# Patient Record
Sex: Male | Born: 1940 | Race: White | Hispanic: No | Marital: Married | Smoking: Former smoker
Health system: Southern US, Community
[De-identification: ages and names within clinical notes are randomized; demographics above are authoritative.]

## PROBLEM LIST (undated history)

## (undated) DIAGNOSIS — J449 Chronic obstructive pulmonary disease, unspecified: Secondary | ICD-10-CM

## (undated) DIAGNOSIS — I493 Ventricular premature depolarization: Secondary | ICD-10-CM

## (undated) HISTORY — PX: INGUINAL HERNIA REPAIR: SUR1180

## (undated) HISTORY — DX: Chronic obstructive pulmonary disease, unspecified: J44.9

## (undated) HISTORY — DX: Ventricular premature depolarization: I49.3

## (undated) HISTORY — PX: ROTATOR CUFF REPAIR: SHX139

---

## 2004-11-22 ENCOUNTER — Ambulatory Visit: Payer: Self-pay | Admitting: Family Medicine

## 2005-03-17 ENCOUNTER — Ambulatory Visit: Payer: Self-pay | Admitting: Family Medicine

## 2005-04-18 ENCOUNTER — Ambulatory Visit: Payer: Self-pay | Admitting: Family Medicine

## 2005-05-02 ENCOUNTER — Ambulatory Visit: Payer: Self-pay | Admitting: Family Medicine

## 2005-06-14 ENCOUNTER — Ambulatory Visit: Payer: Self-pay | Admitting: Family Medicine

## 2005-10-02 ENCOUNTER — Ambulatory Visit: Payer: Self-pay | Admitting: Family Medicine

## 2005-10-04 ENCOUNTER — Ambulatory Visit: Payer: Self-pay | Admitting: Family Medicine

## 2013-03-18 DIAGNOSIS — G629 Polyneuropathy, unspecified: Secondary | ICD-10-CM | POA: Insufficient documentation

## 2016-01-28 DIAGNOSIS — G4733 Obstructive sleep apnea (adult) (pediatric): Secondary | ICD-10-CM | POA: Insufficient documentation

## 2016-01-28 DIAGNOSIS — G4700 Insomnia, unspecified: Secondary | ICD-10-CM | POA: Insufficient documentation

## 2016-01-28 DIAGNOSIS — K219 Gastro-esophageal reflux disease without esophagitis: Secondary | ICD-10-CM | POA: Insufficient documentation

## 2016-01-28 DIAGNOSIS — E782 Mixed hyperlipidemia: Secondary | ICD-10-CM | POA: Insufficient documentation

## 2016-01-28 DIAGNOSIS — G2581 Restless legs syndrome: Secondary | ICD-10-CM | POA: Insufficient documentation

## 2016-01-28 HISTORY — DX: Mixed hyperlipidemia: E78.2

## 2016-01-28 HISTORY — DX: Gastro-esophageal reflux disease without esophagitis: K21.9

## 2016-01-28 HISTORY — DX: Obstructive sleep apnea (adult) (pediatric): G47.33

## 2017-10-08 DIAGNOSIS — M75111 Incomplete rotator cuff tear or rupture of right shoulder, not specified as traumatic: Secondary | ICD-10-CM | POA: Insufficient documentation

## 2017-10-12 DIAGNOSIS — Z01818 Encounter for other preprocedural examination: Secondary | ICD-10-CM

## 2017-10-15 DIAGNOSIS — R55 Syncope and collapse: Secondary | ICD-10-CM | POA: Diagnosis not present

## 2017-10-16 DIAGNOSIS — R55 Syncope and collapse: Secondary | ICD-10-CM | POA: Diagnosis not present

## 2017-10-30 ENCOUNTER — Encounter: Payer: Self-pay | Admitting: *Deleted

## 2017-10-30 ENCOUNTER — Encounter: Payer: Self-pay | Admitting: Cardiology

## 2017-10-30 ENCOUNTER — Ambulatory Visit: Payer: Medicare HMO | Admitting: Cardiology

## 2017-10-30 VITALS — BP 110/80 | HR 86 | Ht 72.0 in | Wt 175.1 lb

## 2017-10-30 DIAGNOSIS — R55 Syncope and collapse: Secondary | ICD-10-CM | POA: Insufficient documentation

## 2017-10-30 DIAGNOSIS — E782 Mixed hyperlipidemia: Secondary | ICD-10-CM | POA: Diagnosis not present

## 2017-10-30 DIAGNOSIS — G9340 Encephalopathy, unspecified: Secondary | ICD-10-CM | POA: Insufficient documentation

## 2017-10-30 DIAGNOSIS — K219 Gastro-esophageal reflux disease without esophagitis: Secondary | ICD-10-CM | POA: Diagnosis not present

## 2017-10-30 DIAGNOSIS — G4733 Obstructive sleep apnea (adult) (pediatric): Secondary | ICD-10-CM | POA: Diagnosis not present

## 2017-10-30 HISTORY — DX: Encephalopathy, unspecified: G93.40

## 2017-10-30 NOTE — Progress Notes (Signed)
Cardiology Office Note:    Date:  10/30/2017   ID:  Kyle Payne, DOB 1941-04-12, MRN [id number]  PCP:  Olive Bassough, Lauryl Seyer L, MD  Cardiologist:  Gypsy Balsamobert Marjani Kobel, MD    Referring MD: Olive Bassough, Mort Smelser L, MD   Chief Complaint  Patient presents with  . Hospitalization Follow-up  I passed out  History of Present Illness:    Kyle Payne is a 77 y.o. male with past medical history significant for recurrent syncope.  I been taking care of him for last few years history is quite interesting every year in March she passed out last year was the first year within last 4 years that he did not passed out in March.  However, he ended up having a right rotator cuff surgery.  He went to his orthopedic doctor for a dressing change and he was sitting in the waiting room started feeling no right, started feeling dizzy this sensation lasted for about 1 minute upon he was very pale during the episodes and then he completely passed out.  EMS was called next thing he knows that a lot of people standing and trying to hold him.  He was perfectly with it after that.  He did not pass stool he did not pass urine he did not bite his tongue.  Episode look very similar to episodes that he described before.  He was taken to the hospital quite extensive evaluation has been done that included a CT of his chest to rule out pulmonary emboli which she did not have, his troponins were normal, echocardiogram was done which showed preserved left ventricular ejection fraction with left ventricular hypertrophy.  Carotid ultrasounds did not show any critical lesions.  Basically there was no clear-cut explanation for episode of syncope.  I always suspected that he does have vasovagal syncope.  Quite extensive evaluation have done before I which was negative.  He tells me also when he sees somebody being injured he get very queasy to the point of passing out.  Past Medical History:  Diagnosis Date  . COPD (chronic obstructive pulmonary disease)  (HCC)       Current Medications: Current Meds  Medication Sig  . aspirin 325 MG tablet Take 1 tablet by mouth daily.  . Cyanocobalamin (B-12 TR) 2000 MCG TBCR Take 1 tablet by mouth daily.  . DOCOSAHEXAENOIC ACID PO Take 2,000 mg by mouth daily.  . Multiple Vitamins-Minerals (CENTRUM ADULTS) TABS Take 1 tablet by mouth daily.  Marland Kitchen. omeprazole (PRILOSEC) 20 MG capsule Take 1 capsule by mouth daily.     Allergies:   Niacin and related   Social History   Socioeconomic History  . Marital status: Married    Spouse name: Not on file  . Number of children: Not on file  . Years of education: Not on file  . Highest education level: Not on file  Occupational History  . Not on file  Social Needs  . Financial resource strain: Not on file  . Food insecurity:    Worry: Not on file    Inability: Not on file  . Transportation needs:    Medical: Not on file    Non-medical: Not on file  Tobacco Use  . Smoking status: Former Games developermoker  . Smokeless tobacco: Never Used  Substance and Sexual Activity  . Alcohol use: Never    Frequency: Never  . Drug use: Never  . Sexual activity: Not on file  Lifestyle  . Physical activity:    Days per  week: Not on file    Minutes per session: Not on file  . Stress: Not on file  Relationships  . Social connections:    Talks on phone: Not on file    Gets together: Not on file    Attends religious service: Not on file    Active member of club or organization: Not on file    Attends meetings of clubs or organizations: Not on file    Relationship status: Not on file  Other Topics Concern  . Not on file  Social History Narrative  . Not on file     Family History: The patient's family history includes Breast cancer in his sister and sister; Hypertension in his father; Stroke in his father. ROS:   Please see the history of present illness.    All 14 point review of systems negative except as described per history of present illness  EKGs/Labs/Other  Studies Reviewed:      Recent Labs: No results found for requested labs within last 8760 hours.  Recent Lipid Panel No results found for: CHOL, TRIG, HDL, CHOLHDL, VLDL, LDLCALC, LDLDIRECT  Physical Exam:    VS:  BP 110/80   Pulse 86   Ht 6' (1.829 m)   Wt 175 lb 1.9 oz (79.4 kg)   SpO2 95%   BMI 23.75 kg/m     Wt Readings from Last 3 Encounters:  10/30/17 175 lb 1.9 oz (79.4 kg)     GEN:  Well nourished, well developed in no acute distress HEENT: Normal NECK: No JVD; No carotid bruits LYMPHATICS: No lymphadenopathy CARDIAC: RRR, no murmurs, no rubs, no gallops RESPIRATORY:  Clear to auscultation without rales, wheezing or rhonchi  ABDOMEN: Soft, non-tender, non-distended MUSCULOSKELETAL:  No edema; No deformity  SKIN: Warm and dry LOWER EXTREMITIES: no swelling NEUROLOGIC:  Alert and oriented x 3 PSYCHIATRIC:  Normal affect   ASSESSMENT:    1. Hyperlipidemia, mixed   2. Vasovagal syncope   3. Gastroesophageal reflux disease without esophagitis   4. Obstructive sleep apnea    PLAN:    In order of problems listed above:  1. Syncope: I suspect this is a vasovagal reaction.  He did have prodromal symptoms before that.  He also gets similar sensation when he see somebody being injured.  He does have previous history of vasovagal syncope.  I still think it would be reasonable to both long-term Holter monitor on him I want him to make sure he understands the degree of bradycardia with his episodes.  I would is quite incredible is the fact that he always have episode of syncope in March there is no logical explanation for this. 2. We discussed preventive measures that he can take to abort or prevent those episodes from happening.  He admits that he does not drink enough fluids he needs to do that.  Also told him to liberalize his salt intake somewhat.  I also instructed him when he will feel distinct coming to sit down afebrile bili lay down to avoid any injury and abort  those episodes. 3. Hyperlipidemia: To be followed by primary care physician.  Try to get a copy of his fasting lipid profile. 4. Gastroesophageal reflux disease: Denies having a problem 5. Selective sleep apnea: Followed by primary care physician.  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  Medication Adjustments/Labs and Tests Ordered: Current medicines are reviewed at length with the patient today.  Concerns regarding medicines are outlined above.  No orders of the defined types were placed in this encounter.  Medication changes: No orders of the defined types were placed in this encounter.   Signed, Georgeanna Lea, MD, [facility] 10/30/2017 10:44 AM    [facility]

## 2017-10-30 NOTE — Patient Instructions (Signed)
Medication Instructions:  Your physician recommends that you continue on your current medications as directed. Please refer to the Current Medication list given to you today.   Labwork: None  Testing/Procedures: Your physician has recommended that you wear an event monitor. Event monitors are medical devices that record the heart's electrical activity. Doctors most often us these monitors to diagnose arrhythmias. Arrhythmias are problems with the speed or rhythm of the heartbeat. The monitor is a small, portable device. You can wear one while you do your normal daily activities. This is usually used to diagnose what is causing palpitations/syncope (passing out). Wear for 30 days.  Follow-Up: Your physician recommends that you schedule a follow-up appointment in: 2 months.  Any Other Special Instructions Will Be Listed Below (If Applicable).     If you need a refill on your cardiac medications before your next appointment, please call your pharmacy.

## 2017-10-31 ENCOUNTER — Telehealth: Payer: Self-pay | Admitting: *Deleted

## 2017-10-31 NOTE — Telephone Encounter (Signed)
Contacted PCP office after patient's office visit yesterday to get most recent lab work. Patient hasn't had a lipid panel checked since March 2015. Dr. Bing MatterKrasowski advised to check lipid panel now. Called to inform patient and the patient stated "He will not take any cholesterol medication" due to side effects "so there is no point in checking" his cholesterol. Dr. Bing MatterKrasowski made aware.

## 2017-10-31 NOTE — Addendum Note (Signed)
Addended by: Crist FatLOCKHART, CATHERINE P on: 10/31/2017 08:31 AM   Modules accepted: Orders

## 2017-11-06 ENCOUNTER — Ambulatory Visit: Payer: Medicare HMO

## 2018-01-07 ENCOUNTER — Ambulatory Visit: Payer: Medicare HMO | Admitting: Cardiology

## 2018-01-07 ENCOUNTER — Encounter: Payer: Self-pay | Admitting: Cardiology

## 2018-01-07 VITALS — BP 120/68 | HR 83 | Ht 72.0 in | Wt 181.0 lb

## 2018-01-07 DIAGNOSIS — E782 Mixed hyperlipidemia: Secondary | ICD-10-CM

## 2018-01-07 DIAGNOSIS — R55 Syncope and collapse: Secondary | ICD-10-CM | POA: Diagnosis not present

## 2018-01-07 DIAGNOSIS — G4733 Obstructive sleep apnea (adult) (pediatric): Secondary | ICD-10-CM

## 2018-01-07 NOTE — Progress Notes (Signed)
Cardiology Office Note:    Date:  01/07/2018   ID:  Kyle Payne, DOB Sep 15, 1940, MRN [id number]  PCP:  Olive Bass, MD  Cardiologist:  Gypsy Balsam, MD    Referring MD: Olive Bass, MD   Chief Complaint  Patient presents with  . Follow-up  Doing well  History of Present Illness:    Kyle Payne is a 77 y.o. male with history of recurrent syncope.  Shane Crutch is quite interesting every year in March she was passed out.  Then he had a break for a few years that he did not passed out however this year it happened again.  It looks like it is a vasovagal syncope.  He does have prodromal syndromes of the top of that when he see somebody getting injections or being hurt he get dizzy to the point of passing out.  He did wear event recorder make sure there is no significant arrhythmia.  He did have some atrial tachycardia short lasting with good sinus node recovery time.  There is no explanation for his syncope based on the results of event recorder.  Therefore.  I will encourage him to stay well-hydrated liberated salt intake and simply sit down and putting his head down if he start feeling that syncope is coming again.  Past Medical History:  Diagnosis Date  . COPD (chronic obstructive pulmonary disease) (HCC)       Current Medications: Current Meds  Medication Sig  . aspirin 325 MG tablet Take 1 tablet by mouth daily.  . Cyanocobalamin (B-12 TR) 2000 MCG TBCR Take 1 tablet by mouth daily.  . DOCOSAHEXAENOIC ACID PO Take 2,000 mg by mouth daily.  . Multiple Vitamins-Minerals (CENTRUM ADULTS) TABS Take 1 tablet by mouth daily.  Marland Kitchen omeprazole (PRILOSEC) 20 MG capsule Take 1 capsule by mouth daily.     Allergies:   Niacin and related   Social History   Socioeconomic History  . Marital status: Married    Spouse name: Not on file  . Number of children: Not on file  . Years of education: Not on file  . Highest education level: Not on file  Occupational History  . Not on  file  Social Needs  . Financial resource strain: Not on file  . Food insecurity:    Worry: Not on file    Inability: Not on file  . Transportation needs:    Medical: Not on file    Non-medical: Not on file  Tobacco Use  . Smoking status: Former Games developer  . Smokeless tobacco: Never Used  Substance and Sexual Activity  . Alcohol use: Never    Frequency: Never  . Drug use: Never  . Sexual activity: Not on file  Lifestyle  . Physical activity:    Days per week: Not on file    Minutes per session: Not on file  . Stress: Not on file  Relationships  . Social connections:    Talks on phone: Not on file    Gets together: Not on file    Attends religious service: Not on file    Active member of club or organization: Not on file    Attends meetings of clubs or organizations: Not on file    Relationship status: Not on file  Other Topics Concern  . Not on file  Social History Narrative  . Not on file     Family History: The patient's family history includes Breast cancer in his sister and sister; Hypertension in  his father; Stroke in his father. ROS:   Please see the history of present illness.    All 14 point review of systems negative except as described per history of present illness  EKGs/Labs/Other Studies Reviewed:      Recent Labs: No results found for requested labs within last 8760 hours.  Recent Lipid Panel No results found for: CHOL, TRIG, HDL, CHOLHDL, VLDL, LDLCALC, LDLDIRECT  Physical Exam:    VS:  BP 120/68   Pulse 83   Ht 6' (1.829 m)   Wt 181 lb (82.1 kg)   SpO2 93%   BMI 24.55 kg/m     Wt Readings from Last 3 Encounters:  01/07/18 181 lb (82.1 kg)  10/30/17 175 lb 1.9 oz (79.4 kg)     GEN:  Well nourished, well developed in no acute distress HEENT: Normal NECK: No JVD; No carotid bruits LYMPHATICS: No lymphadenopathy CARDIAC: RRR, no murmurs, no rubs, no gallops RESPIRATORY:  Clear to auscultation without rales, wheezing or rhonchi  ABDOMEN:  Soft, non-tender, non-distended MUSCULOSKELETAL:  No edema; No deformity  SKIN: Warm and dry LOWER EXTREMITIES: no swelling NEUROLOGIC:  Alert and oriented x 3 PSYCHIATRIC:  Normal affect   ASSESSMENT:    1. Vasovagal syncope   2. Obstructive sleep apnea   3. Hyperlipidemia, mixed    PLAN:    In order of problems listed above:  1. Vasovagal syncope: Plan as outlined above. 2. Obstructive sleep apnea: Managed by internal medicine team. 3. Dyslipidemia: He agreed to have his fasting lipid profile done which I will check it today.  He told me right away to his narcotic any statin since he did have some problem before however there may be some role for [employer] 9 agent  See him back in my office in 6 months   Medication Adjustments/Labs and Tests Ordered: Current medicines are reviewed at length with the patient today.  Concerns regarding medicines are outlined above.  No orders of the defined types were placed in this encounter.  Medication changes: No orders of the defined types were placed in this encounter.   Signed, Georgeanna Leaobert J. Korbyn Chopin, MD, [facility] 01/07/2018 10:30 AM    [facility]

## 2018-01-07 NOTE — Patient Instructions (Signed)
Medication Instructions:  Your physician recommends that you continue on your current medications as directed. Please refer to the Current Medication list given to you today.   Labwork: Your physician recommends that you return for lab work today: lipid panel.   Testing/Procedures: None  Follow-Up: Your physician wants you to follow-up in: 6 months. You will receive a reminder letter in the mail two months in advance. If you don't receive a letter, please call our office to schedule the follow-up appointment.   If you need a refill on your cardiac medications before your next appointment, please call your pharmacy.   Thank you for choosing [facility]! Ora Mcnatt, RN [phone]    

## 2018-01-08 LAB — LIPID PANEL
CHOLESTEROL TOTAL: 252 mg/dL — AB (ref 100–199)
Chol/HDL Ratio: 5.6 ratio — ABNORMAL HIGH (ref 0.0–5.0)
HDL: 45 mg/dL (ref >39)
LDL CALC: 180 mg/dL — AB (ref 0–99)
TRIGLYCERIDES: 133 mg/dL (ref 0–149)
VLDL CHOLESTEROL CAL: 27 mg/dL (ref 5–40)

## 2018-03-08 DIAGNOSIS — G588 Other specified mononeuropathies: Secondary | ICD-10-CM | POA: Insufficient documentation

## 2018-03-08 HISTORY — DX: Other specified mononeuropathies: G58.8

## 2019-04-28 DIAGNOSIS — H2510 Age-related nuclear cataract, unspecified eye: Secondary | ICD-10-CM

## 2019-04-28 HISTORY — DX: Age-related nuclear cataract, unspecified eye: H25.10

## 2019-06-30 DIAGNOSIS — M722 Plantar fascial fibromatosis: Secondary | ICD-10-CM

## 2019-06-30 HISTORY — DX: Plantar fascial fibromatosis: M72.2

## 2020-01-21 DIAGNOSIS — J40 Bronchitis, not specified as acute or chronic: Secondary | ICD-10-CM | POA: Insufficient documentation

## 2020-01-21 HISTORY — DX: Bronchitis, not specified as acute or chronic: J40

## 2020-07-09 ENCOUNTER — Emergency Department (HOSPITAL_COMMUNITY): Payer: Medicare HMO

## 2020-07-09 ENCOUNTER — Other Ambulatory Visit: Payer: Self-pay

## 2020-07-09 ENCOUNTER — Inpatient Hospital Stay (HOSPITAL_COMMUNITY)
Admission: EM | Admit: 2020-07-09 | Discharge: 2020-07-13 | DRG: 871 | Disposition: A | Discharge status: Home / Self Care | Payer: Medicare HMO | Attending: Internal Medicine | Admitting: Internal Medicine

## 2020-07-09 ENCOUNTER — Encounter (HOSPITAL_COMMUNITY): Payer: Self-pay

## 2020-07-09 DIAGNOSIS — I700 Atherosclerosis of aorta: Secondary | ICD-10-CM | POA: Diagnosis present

## 2020-07-09 DIAGNOSIS — G629 Polyneuropathy, unspecified: Secondary | ICD-10-CM | POA: Diagnosis present

## 2020-07-09 DIAGNOSIS — Z87891 Personal history of nicotine dependence: Secondary | ICD-10-CM

## 2020-07-09 DIAGNOSIS — G9340 Encephalopathy, unspecified: Secondary | ICD-10-CM | POA: Diagnosis not present

## 2020-07-09 DIAGNOSIS — R4182 Altered mental status, unspecified: Secondary | ICD-10-CM

## 2020-07-09 DIAGNOSIS — J189 Pneumonia, unspecified organism: Secondary | ICD-10-CM | POA: Diagnosis present

## 2020-07-09 DIAGNOSIS — G459 Transient cerebral ischemic attack, unspecified: Secondary | ICD-10-CM

## 2020-07-09 DIAGNOSIS — E162 Hypoglycemia, unspecified: Secondary | ICD-10-CM | POA: Diagnosis not present

## 2020-07-09 DIAGNOSIS — I340 Nonrheumatic mitral (valve) insufficiency: Secondary | ICD-10-CM | POA: Diagnosis not present

## 2020-07-09 DIAGNOSIS — Z781 Physical restraint status: Secondary | ICD-10-CM | POA: Diagnosis not present

## 2020-07-09 DIAGNOSIS — I952 Hypotension due to drugs: Secondary | ICD-10-CM | POA: Diagnosis not present

## 2020-07-09 DIAGNOSIS — E669 Obesity, unspecified: Secondary | ICD-10-CM | POA: Diagnosis present

## 2020-07-09 DIAGNOSIS — Z0189 Encounter for other specified special examinations: Secondary | ICD-10-CM

## 2020-07-09 DIAGNOSIS — E876 Hypokalemia: Secondary | ICD-10-CM | POA: Diagnosis present

## 2020-07-09 DIAGNOSIS — R680 Hypothermia, not associated with low environmental temperature: Secondary | ICD-10-CM | POA: Diagnosis present

## 2020-07-09 DIAGNOSIS — E785 Hyperlipidemia, unspecified: Secondary | ICD-10-CM | POA: Diagnosis present

## 2020-07-09 DIAGNOSIS — R471 Dysarthria and anarthria: Secondary | ICD-10-CM | POA: Diagnosis present

## 2020-07-09 DIAGNOSIS — Z79899 Other long term (current) drug therapy: Secondary | ICD-10-CM

## 2020-07-09 DIAGNOSIS — I639 Cerebral infarction, unspecified: Secondary | ICD-10-CM

## 2020-07-09 DIAGNOSIS — Z7982 Long term (current) use of aspirin: Secondary | ICD-10-CM | POA: Diagnosis not present

## 2020-07-09 DIAGNOSIS — Z4659 Encounter for fitting and adjustment of other gastrointestinal appliance and device: Secondary | ICD-10-CM

## 2020-07-09 DIAGNOSIS — J9602 Acute respiratory failure with hypercapnia: Secondary | ICD-10-CM | POA: Diagnosis present

## 2020-07-09 DIAGNOSIS — G4733 Obstructive sleep apnea (adult) (pediatric): Secondary | ICD-10-CM | POA: Diagnosis present

## 2020-07-09 DIAGNOSIS — Z823 Family history of stroke: Secondary | ICD-10-CM

## 2020-07-09 DIAGNOSIS — G9341 Metabolic encephalopathy: Secondary | ICD-10-CM | POA: Diagnosis present

## 2020-07-09 DIAGNOSIS — Z803 Family history of malignant neoplasm of breast: Secondary | ICD-10-CM

## 2020-07-09 DIAGNOSIS — J9601 Acute respiratory failure with hypoxia: Secondary | ICD-10-CM

## 2020-07-09 DIAGNOSIS — A419 Sepsis, unspecified organism: Principal | ICD-10-CM | POA: Diagnosis present

## 2020-07-09 DIAGNOSIS — R001 Bradycardia, unspecified: Secondary | ICD-10-CM | POA: Diagnosis not present

## 2020-07-09 DIAGNOSIS — J439 Emphysema, unspecified: Secondary | ICD-10-CM | POA: Diagnosis present

## 2020-07-09 DIAGNOSIS — Z8249 Family history of ischemic heart disease and other diseases of the circulatory system: Secondary | ICD-10-CM

## 2020-07-09 DIAGNOSIS — Z20822 Contact with and (suspected) exposure to covid-19: Secondary | ICD-10-CM | POA: Diagnosis present

## 2020-07-09 DIAGNOSIS — T4275XA Adverse effect of unspecified antiepileptic and sedative-hypnotic drugs, initial encounter: Secondary | ICD-10-CM | POA: Diagnosis not present

## 2020-07-09 DIAGNOSIS — R946 Abnormal results of thyroid function studies: Secondary | ICD-10-CM | POA: Diagnosis present

## 2020-07-09 HISTORY — DX: Acute respiratory failure with hypoxia: J96.01

## 2020-07-09 LAB — I-STAT CHEM 8, ED
BUN: 23 mg/dL (ref 8–23)
Calcium, Ion: 1.22 mmol/L (ref 1.15–1.40)
Chloride: 100 mmol/L (ref 98–111)
Creatinine, Ser: 1 mg/dL (ref 0.61–1.24)
Glucose, Bld: 94 mg/dL (ref 70–99)
HCT: 46 % (ref 39.0–52.0)
Hemoglobin: 15.6 g/dL (ref 13.0–17.0)
Potassium: 3.9 mmol/L (ref 3.5–5.1)
Sodium: 138 mmol/L (ref 135–145)
TCO2: 27 mmol/L (ref 22–32)

## 2020-07-09 LAB — COMPREHENSIVE METABOLIC PANEL
ALT: 21 U/L (ref 0–44)
AST: 22 U/L (ref 15–41)
Albumin: 4.1 g/dL (ref 3.5–5.0)
Alkaline Phosphatase: 49 U/L (ref 38–126)
Anion gap: 12 (ref 5–15)
BUN: 19 mg/dL (ref 8–23)
CO2: 27 mmol/L (ref 22–32)
Calcium: 9.6 mg/dL (ref 8.9–10.3)
Chloride: 99 mmol/L (ref 98–111)
Creatinine, Ser: 1.09 mg/dL (ref 0.61–1.24)
GFR, Estimated: >60 mL/min (ref >60)
Glucose, Bld: 97 mg/dL (ref 70–99)
Potassium: 3.8 mmol/L (ref 3.5–5.1)
Sodium: 138 mmol/L (ref 135–145)
Total Bilirubin: 0.6 mg/dL (ref 0.3–1.2)
Total Protein: 7.1 g/dL (ref 6.5–8.1)

## 2020-07-09 LAB — DIFFERENTIAL
Abs Immature Granulocytes: 0.02 10*3/uL (ref 0.00–0.07)
Basophils Absolute: 0 10*3/uL (ref 0.0–0.1)
Basophils Relative: 1 %
Eosinophils Absolute: 0.2 10*3/uL (ref 0.0–0.5)
Eosinophils Relative: 3 %
Immature Granulocytes: 0 %
Lymphocytes Relative: 27 %
Lymphs Abs: 1.5 10*3/uL (ref 0.7–4.0)
Monocytes Absolute: 0.8 10*3/uL (ref 0.1–1.0)
Monocytes Relative: 14 %
Neutro Abs: 3.2 10*3/uL (ref 1.7–7.7)
Neutrophils Relative %: 55 %

## 2020-07-09 LAB — I-STAT ARTERIAL BLOOD GAS, ED
Acid-base deficit: 1 mmol/L (ref 0.0–2.0)
Bicarbonate: 27.5 mmol/L (ref 20.0–28.0)
Calcium, Ion: 1.18 mmol/L (ref 1.15–1.40)
HCT: 40 % (ref 39.0–52.0)
Hemoglobin: 13.6 g/dL (ref 13.0–17.0)
O2 Saturation: 100 %
Patient temperature: 98.6
Potassium: 4 mmol/L (ref 3.5–5.1)
Sodium: 137 mmol/L (ref 135–145)
TCO2: 29 mmol/L (ref 22–32)
pCO2 arterial: 60.5 mmHg — ABNORMAL HIGH (ref 32.0–48.0)
pH, Arterial: 7.265 — ABNORMAL LOW (ref 7.350–7.450)
pO2, Arterial: 332 mmHg — ABNORMAL HIGH (ref 83.0–108.0)

## 2020-07-09 LAB — PROTIME-INR
INR: 1 (ref 0.8–1.2)
Prothrombin Time: 13 seconds (ref 11.4–15.2)

## 2020-07-09 LAB — URINALYSIS, ROUTINE W REFLEX MICROSCOPIC
Bilirubin Urine: NEGATIVE
Glucose, UA: NEGATIVE mg/dL
Hgb urine dipstick: NEGATIVE
Ketones, ur: NEGATIVE mg/dL
Leukocytes,Ua: NEGATIVE
Nitrite: NEGATIVE
Protein, ur: NEGATIVE mg/dL
Specific Gravity, Urine: >1.046 — ABNORMAL HIGH (ref 1.005–1.030)
pH: 5 (ref 5.0–8.0)

## 2020-07-09 LAB — CBG MONITORING, ED
Glucose-Capillary: 88 mg/dL (ref 70–99)
Glucose-Capillary: 93 mg/dL (ref 70–99)

## 2020-07-09 LAB — CBC
HCT: 45.3 % (ref 39.0–52.0)
Hemoglobin: 14.2 g/dL (ref 13.0–17.0)
MCH: 29.7 pg (ref 26.0–34.0)
MCHC: 31.3 g/dL (ref 30.0–36.0)
MCV: 94.8 fL (ref 80.0–100.0)
Platelets: 193 10*3/uL (ref 150–400)
RBC: 4.78 MIL/uL (ref 4.22–5.81)
RDW: 13.2 % (ref 11.5–15.5)
WBC: 5.8 10*3/uL (ref 4.0–10.5)
nRBC: 0 % (ref 0.0–0.2)

## 2020-07-09 LAB — RAPID URINE DRUG SCREEN, HOSP PERFORMED
Amphetamines: NOT DETECTED
Barbiturates: NOT DETECTED
Benzodiazepines: NOT DETECTED
Cocaine: NOT DETECTED
Opiates: NOT DETECTED
Tetrahydrocannabinol: NOT DETECTED

## 2020-07-09 LAB — RESP PANEL BY RT-PCR (FLU A&B, COVID) ARPGX2
Influenza A by PCR: NEGATIVE
Influenza B by PCR: NEGATIVE
SARS Coronavirus 2 by RT PCR: NEGATIVE

## 2020-07-09 LAB — APTT: aPTT: 34 seconds (ref 24–36)

## 2020-07-09 LAB — ETHANOL: Alcohol, Ethyl (B): <10 mg/dL (ref <10)

## 2020-07-09 MED ORDER — SODIUM CHLORIDE 0.9 % IV SOLN
2.0000 g | Freq: Three times a day (TID) | INTRAVENOUS | Status: DC
  Administered 2020-07-09 – 2020-07-13 (×11): 2 g via INTRAVENOUS
  Filled 2020-07-09 (×11): qty 2

## 2020-07-09 MED ORDER — FENTANYL 2500MCG IN NS 250ML (10MCG/ML) PREMIX INFUSION
25.0000 ug/h | INTRAVENOUS | Status: DC
  Administered 2020-07-09: 75 ug/h via INTRAVENOUS
  Filled 2020-07-09: qty 250

## 2020-07-09 MED ORDER — FENTANYL CITRATE (PF) 100 MCG/2ML IJ SOLN
INTRAMUSCULAR | Status: AC | PRN
  Administered 2020-07-09: 50 ug via INTRAVENOUS

## 2020-07-09 MED ORDER — FENTANYL CITRATE (PF) 100 MCG/2ML IJ SOLN
25.0000 ug | Freq: Once | INTRAMUSCULAR | Status: AC
  Administered 2020-07-09: 25 ug via INTRAVENOUS

## 2020-07-09 MED ORDER — PROPOFOL 1000 MG/100ML IV EMUL
0.0000 ug/kg/min | INTRAVENOUS | Status: DC
  Administered 2020-07-09: 30 ug/kg/min via INTRAVENOUS
  Administered 2020-07-10: 02:00:00 10 ug/kg/min via INTRAVENOUS
  Filled 2020-07-09: qty 100

## 2020-07-09 MED ORDER — INSULIN ASPART 100 UNIT/ML ~~LOC~~ SOLN: 2.0000 [IU] | SUBCUTANEOUS | Status: DC

## 2020-07-09 MED ORDER — ETOMIDATE 2 MG/ML IV SOLN
INTRAVENOUS | Status: AC | PRN
  Administered 2020-07-09: 20 mg via INTRAVENOUS

## 2020-07-09 MED ORDER — PROPOFOL 1000 MG/100ML IV EMUL
INTRAVENOUS | Status: AC | PRN
  Administered 2020-07-09: 10 ug/kg/min via INTRAVENOUS

## 2020-07-09 MED ORDER — DOCUSATE SODIUM 100 MG PO CAPS: 100.0000 mg | ORAL_CAPSULE | Freq: Two times a day (BID) | ORAL | Status: DC | PRN

## 2020-07-09 MED ORDER — POLYETHYLENE GLYCOL 3350 17 G PO PACK: 17.0000 g | PACK | Freq: Every day | ORAL | Status: DC | PRN

## 2020-07-09 MED ORDER — POLYETHYLENE GLYCOL 3350 17 G PO PACK: 17.0000 g | PACK | Freq: Every day | ORAL | Status: DC

## 2020-07-09 MED ORDER — ENOXAPARIN SODIUM 40 MG/0.4ML ~~LOC~~ SOLN
40.0000 mg | SUBCUTANEOUS | Status: DC
  Administered 2020-07-09 – 2020-07-12 (×4): 40 mg via SUBCUTANEOUS
  Filled 2020-07-09 (×4): qty 0.4

## 2020-07-09 MED ORDER — PIPERACILLIN-TAZOBACTAM 3.375 G IVPB 30 MIN: 3.3750 g | Freq: Once | INTRAVENOUS | Status: DC

## 2020-07-09 MED ORDER — PANTOPRAZOLE SODIUM 40 MG PO TBEC
40.0000 mg | DELAYED_RELEASE_TABLET | Freq: Every day | ORAL | Status: DC
  Administered 2020-07-10 – 2020-07-13 (×4): 40 mg via ORAL
  Filled 2020-07-09 (×4): qty 1

## 2020-07-09 MED ORDER — VANCOMYCIN HCL 1750 MG/350ML IV SOLN
1750.0000 mg | Freq: Once | INTRAVENOUS | Status: AC
  Administered 2020-07-09: 1750 mg via INTRAVENOUS
  Filled 2020-07-09: qty 350

## 2020-07-09 MED ORDER — IOHEXOL 350 MG/ML SOLN
100.0000 mL | Freq: Once | INTRAVENOUS | Status: AC | PRN
  Administered 2020-07-09: 100 mL via INTRAVENOUS

## 2020-07-09 MED ORDER — IPRATROPIUM-ALBUTEROL 0.5-2.5 (3) MG/3ML IN SOLN: 3.0000 mL | Freq: Four times a day (QID) | RESPIRATORY_TRACT | Status: DC

## 2020-07-09 MED ORDER — SUCCINYLCHOLINE CHLORIDE 20 MG/ML IJ SOLN
INTRAMUSCULAR | Status: AC | PRN
  Administered 2020-07-09: 100 mg via INTRAVENOUS

## 2020-07-09 MED ORDER — FENTANYL BOLUS VIA INFUSION
25.0000 ug | INTRAVENOUS | Status: DC | PRN
Start: 2020-07-09 — End: 2020-07-10
  Filled 2020-07-09: qty 25

## 2020-07-09 MED ORDER — VANCOMYCIN HCL IN DEXTROSE 1-5 GM/200ML-% IV SOLN: 1000.0000 mg | Freq: Two times a day (BID) | INTRAVENOUS | Status: DC

## 2020-07-09 MED ORDER — VANCOMYCIN HCL IN DEXTROSE 1-5 GM/200ML-% IV SOLN: 1000.0000 mg | Freq: Once | INTRAVENOUS | Status: DC

## 2020-07-09 MED ORDER — FENTANYL CITRATE (PF) 100 MCG/2ML IJ SOLN
INTRAMUSCULAR | Status: AC
  Filled 2020-07-09: qty 2

## 2020-07-09 MED ORDER — SODIUM CHLORIDE 0.9 % IV SOLN
500.0000 mg | Freq: Every day | INTRAVENOUS | Status: DC
  Administered 2020-07-10 – 2020-07-12 (×3): 500 mg via INTRAVENOUS
  Filled 2020-07-09 (×3): qty 500

## 2020-07-09 MED ORDER — IPRATROPIUM-ALBUTEROL 0.5-2.5 (3) MG/3ML IN SOLN
3.0000 mL | Freq: Four times a day (QID) | RESPIRATORY_TRACT | Status: DC
  Administered 2020-07-10 (×3): 3 mL via RESPIRATORY_TRACT
  Filled 2020-07-09 (×3): qty 3

## 2020-07-09 MED ORDER — NOREPINEPHRINE 4 MG/250ML-% IV SOLN: 0.0000 ug/min | INTRAVENOUS | Status: DC

## 2020-07-09 MED ORDER — NOREPINEPHRINE 4 MG/250ML-% IV SOLN
INTRAVENOUS | Status: AC
  Administered 2020-07-09: 21:00:00 2 ug/min via INTRAVENOUS
  Filled 2020-07-09: qty 250

## 2020-07-09 MED ORDER — DOCUSATE SODIUM 50 MG/5ML PO LIQD: 100.0000 mg | Freq: Two times a day (BID) | ORAL | Status: DC

## 2020-07-09 NOTE — Code Documentation (Signed)
Stroke Response Nurse Documentation Code Documentation  Kyle Payne is a 79 y.o. male arriving to [facility] ED via [facility] on 12/17 with past medical hx of COPD. Code stroke was activated by EMS. Patient from home where he was LKW at 1710 and now complaining of respiratory distress, slurred speech and left sided weakness. On ASA per patient. Stroke team at the bedside on patient arrival. Labs drawn and patient cleared for CT by Dr. Peterson Lombard. Patient to CT after intubation with team. NIHSS 12, see documentation for details and code stroke times. Patient with decreased LOC, disoriented, not following commands, bilateral leg weakness, Global aphasia  and dysarthria  on exam. The following imaging was completed:  CTA head and neck, CTP. Patient is not a candidate for tPA. Bedside handoff with ED RN Melissa.    Rose Fillers  Rapid Response RN

## 2020-07-09 NOTE — Consult Note (Signed)
Referring Physician: Dr. Madilyn Hook    Chief Complaint: Acute onset of dysarthria and left sided weakness in conjunction with respiratory distress  HPI: Kyle Payne is an 79 y.o. male with a history of COPD presenting with acute onset of respiratory distress in conjunction with dysarthria and left sided weakness. Per EMS, his sats dropped to the low 80's on arrival but were brought up to low 90's on NRB. BP 160/90, HR 80 and CBG 167 in the field. Per EMS, he was consistently weak on the left, with LUE quickly dropping to his side after he lifted it and with weak LLE when EMS tried to walk him. He was also dysarthric. No seizure like activity was reported by wife. On arrival the patient was somnolent with sonorous respirations that became stridorous when laid supine. Decision was made to intubate.   Prior to intubation, the patient's neurological exam revealed diffuse limb weakness and fatigueability but no asymmetry in strength or responses to sensory stimuli.   He takes ASA at home. He is not on an anticoagulant.   On limited ROS he denies headache, CP and N/V.   LSN: [id number] tPA Given: No: No lateralizing deficits on exam.  NIHSS: 12  Past Medical History:  Diagnosis Date  . COPD (chronic obstructive pulmonary disease) (HCC)     Past Surgical History:  Procedure Laterality Date  . INGUINAL HERNIA REPAIR    . ROTATOR CUFF REPAIR      Family History  Problem Relation Age of Onset  . Hypertension Father   . Stroke Father   . Breast cancer Sister   . Breast cancer Sister    Social History:  reports that he has quit smoking. He has never used smokeless tobacco. He reports that he does not drink alcohol and does not use drugs.  Allergies:  Allergies  Allergen Reactions  . Niacin And Related     Home Medications:  No current facility-administered medications on file prior to encounter.   Current Outpatient Medications on File Prior to Encounter  Medication Sig Dispense Refill  .  aspirin 325 MG tablet Take 1 tablet by mouth daily.    . Cyanocobalamin (B-12 TR) 2000 MCG TBCR Take 1 tablet by mouth daily.    . DOCOSAHEXAENOIC ACID PO Take 2,000 mg by mouth daily.    . Multiple Vitamins-Minerals (CENTRUM ADULTS) TABS Take 1 tablet by mouth daily.    Marland Kitchen omeprazole (PRILOSEC) 20 MG capsule Take 1 capsule by mouth daily.       ROS: As per HPI. Unable to obtain detailed ROS due to somnolence.   Physical Examination: Blood pressure 120/85, pulse 84, resp. rate (!) 4, height 6' (1.829 m), weight 87.4 kg, SpO2 98 %.  HEENT: [place]/AT Lungs: On NRB with sonorous respirations Ext: Noncyanotic.   Neurologic Examination: Mental Status: Somnolent. Will arouse to a semi-awake state and follow some simple commands but rapidly falls asleep again. Will name his thumb and ear correctly. Not speaking in full sentences - unable to assess for fluency. Able to state that he takes ASA at home. Severe dysarthria.  Cranial Nerves: II:  PERRL. Did not blink to threat in the context of depressed level of consciousness III,IV, VI: When aroused, did follow command to gaze to right and left - no asymmetry. No nystagmus.  V,VII: Grimace symmetric, reacts to brow ridge pressure  VIII: hearing intact to voice IX,X: Pharyngeal dysarthria.  XI: Head is midline.  XII: Briefly protruded tongue midline.  Motor/Sensory: Decreased tone  x 4 in the context of somnolent state.  Will elevate BUE antigravity and hold for > 10 seconds, without asymmetry. Grips are equal. Did not follow more detailed upper extremity motor commands.  Will vigorously and briskly withdraw BLE to noxious plantar stimulation without asymmetry. Reacts to pinch equally x 4.  Deep Tendon Reflexes:  2+ bilateral brachioradialis and patellae Plantars: Right: downgoing  Left: downgoing Cerebellar: Not following commands for testing.  Gait: Unable to assess.   Results for orders placed or performed during the hospital encounter of  07/09/20 (from the past 48 hour(s))  CBG monitoring, ED     Status: None   Collection Time: 07/09/20  7:23 PM  Result Value Ref Range   Glucose-Capillary 88 70 - 99 mg/dL    Comment: Glucose reference range applies only to samples taken after fasting for at least 8 hours.  Protime-INR     Status: None   Collection Time: 07/09/20  7:25 PM  Result Value Ref Range   Prothrombin Time 13.0 11.4 - 15.2 seconds   INR 1.0 0.8 - 1.2    Comment: (NOTE) INR goal varies based on device and disease states. Performed at [facility], [address], [place], [place] [address]   APTT     Status: None   Collection Time: 07/09/20  7:25 PM  Result Value Ref Range   aPTT 34 24 - 36 seconds    Comment: Performed at [facility], [address], [place], [place] [address]  CBC     Status: None   Collection Time: 07/09/20  7:25 PM  Result Value Ref Range   WBC 5.8 4.0 - 10.5 K/uL   RBC 4.78 4.22 - 5.81 MIL/uL   Hemoglobin 14.2 13.0 - 17.0 g/dL   HCT 29.7 98.9 - 21.1 %   MCV 94.8 80.0 - 100.0 fL   MCH 29.7 26.0 - 34.0 pg   MCHC 31.3 30.0 - 36.0 g/dL   RDW 94.1 74.0 - 81.4 %   Platelets 193 150 - 400 K/uL   nRBC 0.0 0.0 - 0.2 %    Comment: Performed at [facility], [address], [place], [place] [address]  Differential     Status: None   Collection Time: 07/09/20  7:25 PM  Result Value Ref Range   Neutrophils Relative % 55 %   Neutro Abs 3.2 1.7 - 7.7 K/uL   Lymphocytes Relative 27 %   Lymphs Abs 1.5 0.7 - 4.0 K/uL   Monocytes Relative 14 %   Monocytes Absolute 0.8 0.1 - 1.0 K/uL   Eosinophils Relative 3 %   Eosinophils Absolute 0.2 0.0 - 0.5 K/uL   Basophils Relative 1 %   Basophils Absolute 0.0 0.0 - 0.1 K/uL   Immature Granulocytes 0 %   Abs Immature Granulocytes 0.02 0.00 - 0.07 K/uL    Comment: Performed at [facility], [address], [place], [place] [address]  I-stat chem 8, ED     Status: None   Collection Time: 07/09/20  7:28 PM  Result Value Ref  Range   Sodium 138 135 - 145 mmol/L   Potassium 3.9 3.5 - 5.1 mmol/L   Chloride 100 98 - 111 mmol/L   BUN 23 8 - 23 mg/dL   Creatinine, Ser 7.02 0.61 - 1.24 mg/dL   Glucose, Bld 94 70 - 99 mg/dL    Comment: Glucose reference range applies only to samples taken after fasting for at least  8 hours.   Calcium, Ion 1.22 1.15 - 1.40 mmol/L   TCO2 27 22 - 32 mmol/L   Hemoglobin 15.6 13.0 - 17.0 g/dL   HCT 62.3 76.2 - 83.1 %  CBG monitoring, ED     Status: None   Collection Time: 07/09/20  7:43 PM  Result Value Ref Range   Glucose-Capillary 93 70 - 99 mg/dL    Comment: Glucose reference range applies only to samples taken after fasting for at least 8 hours.   No results found.  Assessment: 80 y.o. male presenting with acute onset of left sided weakness and respiratory distress. Left sided weakness has resolved just prior to intubation.  1. CT head preliminary read by Radiology. No acute abnormality.  2. CTA of head and neck with CTP: Normal CT angiography of the head and neck vessels. Negative perfusion study. 3. Stroke Risk Factors - None 4. Not a tPA candidate due to no lateralizing deficits on exam to indicate a stroke. NIHSS score is elevated most likely due to the patient's AMS. Risks of IV tPA outweigh benefits. 5. Not an endovascular candidate due to no LVO 6. Overall presentation most consistent with TIA in conjunction with respiratory compromise. Unclear if the two possibilities are etiologically related.   Recommendations: 1. STAT MRI of the brain without contrast 2. Frequent neuro checks 3. Further recommendations pending results of MRI, but will need completion of full TIA work up to include TTE and cardiac telemetry.  4. Will need to consult CCM for admission and vent management.  5. Continue ASA 325 mg po qd  @Electronically  signed: Dr.  07/09/2020, 7:53 PM

## 2020-07-09 NOTE — H&P (Signed)
Kyle Payne, MRN:  [id number], DOB:  May 11, 1941, LOS: 0 ADMISSION DATE:  07/09/2020, CONSULTATION DATE:  12/17 REFERRING MD:  Madilyn Hook- ED, CHIEF COMPLAINT:  Acute respiratory failure   Brief History:  Acute onset weakness, then progressive encephalopathy, sonorous respirations. Intubated in the ED for airway protection to facilitate stroke workup.  History of Present Illness:  Kyle Payne is a 79 y/o gentleman who was brought to the ED after sudden onset leg weakness, first in his left leg then his right. History obtained from sister at bedside in the ED, EDP, and chart review. He had left facial droop and mildly slurred speech, which per his sister slurred speech is his baseline. He had weakness in all extremities upon presentation and had progressive encephalopathy. developing somnolence and snoring with likely obstructive apneas when he was laid flat in the ED. He was intubated for airway protection. CT perfusion study did not demonstrate a CVA. He had transient hypotension due to sedation responsive to short-term pressors and IVF. Per sister at bedside he has not had new complaints recently. No new medications that she is aware of. Vaccinated and boosted for covid. His sister thinks he is UTD on flu vaccine as well.  Past Medical History:  COPD - no PFTs, no PTA inhalers Vasovagal syncope OSA- not on CPAP? HLD  Significant Hospital Events:  Admitted, intubated 12/17>  Consults:  Neuro  Procedures:  ETT 12/17>  Significant Diagnostic Tests:  CT brain perfusion> no acute CVAs  Micro Data:  Covid negative Flu A & B negative  Antimicrobials:  vanc 12/17> Pip-tazo 12/17> Azithromycin 12/17>  Interim History / Subjective:    Objective   Blood pressure 129/85, pulse 65, resp. rate 17, height (S) 6' (1.829 m), weight 87.4 kg, SpO2 100 %.    Vent Mode: PRVC FiO2 (%):  [40 %-100 %] 40 % Set Rate:  [16 bmp-20 bmp] 20 bmp Vt Set:  [620 mL] 620 mL PEEP:  [5 cmH20] 5  cmH20 Plateau Pressure:  [9 cmH20] 9 cmH20  No intake or output data in the 24 hours ending 07/09/20 2157 Filed Weights   07/09/20 1934  Weight: 87.4 kg    Examination: General: critically ill appearing man laying in bed in NAD, intubated and sedated HENT: Kyle Payne/AT, eyes anicteric Lungs: Rhonchi bilaterally cleared with suctioning, small amount of very thick yellow tracheal secretions. Cardiovascular: RRR, no murmurs Abdomen: soft, NT, +bowel sounds Extremities: No c/c/e, symmetric DP and PT pulses- PT weaker symmetrically than PTs. Feet not cold or cyanotic. Neuro: RASS -4, no response to nailbed pressure or Babinski in LEs. Small but reactive pupils. Opens eyes and pulls against restraints with suctioning. Strong cough. GU: foley, normal external anatomy Derm: no rashes  CXR personally reviewed> R pleural effusion, LLL opacity  Resolved Hospital Problem list     Assessment & Plan:  Acute respiratory failure with hypoxia and hypercapnia; unclear if this was precipitated by mental status changes or possibly LLL pneumonia. Emphysema on CT scan. -LTVV, 4-8cc/kg IBW with goal Pplat<30 and DP<15 -PAD protocol for sedation, goal RASS -1 -titrate vent to ARDS protocol -follow up ABG & CXR in AM -trach aspirate pending -bronchodilators Q6h -Empiric antibiotics for severe CAP- azithromycin, B-lactam, and vanc. Deescalate if pneumonia is confirmed.  -urinary legionella and pneumococcus antigens  Acute encephalopathy, potentially due to hypercapnia vs sepsis vs less likely CVA. UA not suggestive of UTI. On high dose gabapentin at home, but this is a long-term med and renal function  is stable. -brain MRI ordered -UDS pending -Empiric cultures- blood, urine, trach aspirate and empiric antibiotic coverage for sepsis  Diffuse weakness at presentation, acute onset -ongoing neuro assessments as sedation is decreased to determine if additional imaging of the spine is needed for persistent leg  weakness -empiric sepsis treatment  Sister updated at bedside.  Best practice (evaluated daily)  Diet: NPO Pain/Anxiety/Delirium protocol (if indicated): yes VAP protocol (if indicated): yes DVT prophylaxis: lovenox GI prophylaxis: pantoprazole Glucose control: SSI Mobility: progressive Disposition: ICU  Goals of Care:  Last date of multidisciplinary goals of care discussion:  Family and staff present:   Summary of discussion:   Follow up goals of care discussion due: 12/18 Code Status: full  Labs   CBC: Recent Labs  Lab 07/09/20 1925 07/09/20 1928 07/09/20 2145  WBC 5.8  --   --   NEUTROABS 3.2  --   --   HGB 14.2 15.6 13.6  HCT 45.3 46.0 40.0  MCV 94.8  --   --   PLT 193  --   --     Basic Metabolic Panel: Recent Labs  Lab 07/09/20 1925 07/09/20 1928 07/09/20 2145  NA 138 138 137  K 3.8 3.9 4.0  CL 99 100  --   CO2 27  --   --   GLUCOSE 97 94  --   BUN 19 23  --   CREATININE 1.09 1.00  --   CALCIUM 9.6  --   --    GFR: Estimated Creatinine Clearance: 65.7 mL/min (by C-G formula based on SCr of 1 mg/dL). Recent Labs  Lab 07/09/20 1925  WBC 5.8    Liver Function Tests: Recent Labs  Lab 07/09/20 1925  AST 22  ALT 21  ALKPHOS 49  BILITOT 0.6  PROT 7.1  ALBUMIN 4.1   No results for input(s): LIPASE, AMYLASE in the last 168 hours. No results for input(s): AMMONIA in the last 168 hours.  ABG    Component Value Date/Time   PHART 7.265 (L) 07/09/2020 2145   PCO2ART 60.5 (H) 07/09/2020 2145   PO2ART 332 (H) 07/09/2020 2145   HCO3 27.5 07/09/2020 2145   TCO2 29 07/09/2020 2145   ACIDBASEDEF 1.0 07/09/2020 2145   O2SAT 100.0 07/09/2020 2145     Coagulation Profile: Recent Labs  Lab 07/09/20 1925  INR 1.0    Cardiac Enzymes: No results for input(s): CKTOTAL, CKMB, CKMBINDEX, TROPONINI in the last 168 hours.  HbA1C: No results found for: HGBA1C  CBG: Recent Labs  Lab 07/09/20 1923 07/09/20 1943  GLUCAP 88 93    Review of  Systems:   Unable to be obtained due to mental status.  Past Medical History:  He,  has a past medical history of COPD (chronic obstructive pulmonary disease) (HCC).   Surgical History:   Past Surgical History:  Procedure Laterality Date  . INGUINAL HERNIA REPAIR    . ROTATOR CUFF REPAIR       Social History:   reports that he has quit smoking. He has never used smokeless tobacco. He reports that he does not drink alcohol and does not use drugs.   Family History:  His family history includes Breast cancer in his sister and sister; Hypertension in his father; Stroke in his father.   Allergies Allergies  Allergen Reactions  . Niacin And Related      Home Medications  Prior to Admission medications   Medication Sig Start Date End Date Taking? Authorizing Provider  aspirin 325  MG tablet Take 1 tablet by mouth daily.    [provider]  Cyanocobalamin (B-12 TR) 2000 MCG TBCR Take 1 tablet by mouth daily.    [provider]  DOCOSAHEXAENOIC ACID PO Take 2,000 mg by mouth daily.    [provider]  Multiple Vitamins-Minerals (CENTRUM ADULTS) TABS Take 1 tablet by mouth daily.    [provider]  omeprazole (PRILOSEC) 20 MG capsule Take 1 capsule by mouth daily. 07/23/15   [provider]     Critical care time: 56 min.    Steffanie Dunn, DO 07/09/20 11:02 PM [facility]

## 2020-07-09 NOTE — Progress Notes (Signed)
Pharmacy Antibiotic Note  Kyle Payne is a 79 y.o. male admitted on 07/09/2020 with sepsis.  Pharmacy has been consulted for Cefepime and Vancomycin dosing.   Height: (S) 6' (182.9 cm) (measured x 3) Weight: 87.4 kg (192 lb 10.9 oz) IBW/kg (Calculated) : 77.6  No data recorded.  Recent Labs  Lab 07/09/20 1925 07/09/20 1928  WBC 5.8  --   CREATININE 1.09 1.00    Estimated Creatinine Clearance: 65.7 mL/min (by C-G formula based on SCr of 1 mg/dL).    Allergies  Allergen Reactions  . Niacin And Related Other (See Comments)    Unknown Reaction    Antimicrobials this admission: 12/17 Cefepime >>  12/17 Vancomycin >>   Dose adjustments this admission: N/a  Microbiology results: Pending   Plan:  - Cefepime 2g IV q8h - Vancomycin 1750mg  IV x 1 dose  - Followed by Vancomycin 1000mg  IV q12h - Goal trough ~ 15 - Monitor patients renal function and urine output  - De-escalate ABX when appropriate   Thank you for allowing pharmacy to be a part of this patient's care.  PharmD. BCPS 07/09/2020 11:04 PM

## 2020-07-09 NOTE — ED Triage Notes (Signed)
Patient arrives with [facility] from home, patient was sitting at the table with his wife and slumped over to the left, EMS reports L sided weakness and slurred speech.   EMS vitals  160/90  80 HR 94% on NRB 167 CBG

## 2020-07-09 NOTE — ED Provider Notes (Signed)
[facility] EMERGENCY DEPARTMENT Provider Note   CSN: [id number] Arrival date & time: 07/09/20  1920     History Chief Complaint  Patient presents with  . Code Stroke    Kyle Payne is a 79 y.o. male.  The history is provided by the EMS personnel and medical records.   Kyle Payne is a 79 y.o. male who presents to the Emergency Department complaining of code stroke.  Level V caveat due to AMS.  Hx is provided by EMS.  He presents to the ED by [facility] as a code stroke for AMS, last known normal at 5pm.  While at dinner he slumped over to the left and became less responsive.  EMS reports left sided weakness, hypoxia to 88%.      Past Medical History:  Diagnosis Date  . COPD (chronic obstructive pulmonary disease) [facility])     Patient Active Problem List   Diagnosis Date Noted  . Acute respiratory failure with hypoxia and hypercapnia (HCC) 07/09/2020  . Syncope 10/30/2017  . GERD (gastroesophageal reflux disease) 01/28/2016  . Hyperlipidemia, mixed 01/28/2016  . Obstructive sleep apnea 01/28/2016    Past Surgical History:  Procedure Laterality Date  . INGUINAL HERNIA REPAIR    . ROTATOR CUFF REPAIR         Family History  Problem Relation Age of Onset  . Hypertension Father   . Stroke Father   . Breast cancer Sister   . Breast cancer Sister     Social History   Tobacco Use  . Smoking status: Former Games developer  . Smokeless tobacco: Never Used  Vaping Use  . Vaping Use: Never used  Substance Use Topics  . Alcohol use: Never  . Drug use: Never    Home Medications Prior to Admission medications   Medication Sig Start Date End Date Taking? Authorizing Provider  amitriptyline (ELAVIL) 75 MG tablet Take 75 mg by mouth at bedtime. 06/16/20  Yes [provider]  Cyanocobalamin 2000 MCG TBCR Take 2,000 mcg by mouth daily.   Yes [provider]  gabapentin (NEURONTIN) 800 MG tablet Take 800 mg by mouth 4 (four) times daily.  01/27/20  Yes [provider]  ibuprofen (ADVIL) 200 MG tablet Take 400 mg by mouth every 6 (six) hours as needed for fever.   Yes [provider]  Multiple Vitamins-Minerals (CENTRUM ADULTS) TABS Take 1 tablet by mouth daily.   Yes [provider]  omeprazole (PRILOSEC) 20 MG capsule Take 20 mg by mouth daily. 07/23/15  Yes [provider]    Allergies    Niacin and related  Review of Systems   Review of Systems  Unable to perform ROS: Mental status change    Physical Exam Updated Vital Signs BP 110/76   Pulse (!) 56   Temp (!) 96.9 F (36.1 C) (Bladder)   Resp 20   Ht (S) 6' (1.829 m) Comment: measured x 3  Wt 87.4 kg   SpO2 96%   BMI 26.13 kg/m   Physical Exam Vitals and nursing note reviewed.  Constitutional:      Appearance: He is well-developed and well-nourished.     Comments: lethargic  HENT:     Head: Normocephalic and atraumatic.  Cardiovascular:     Rate and Rhythm: Normal rate and regular rhythm.     Heart sounds: No murmur heard.   Pulmonary:     Effort: Pulmonary effort is normal. No respiratory distress.  Breath sounds: Normal breath sounds.  Abdominal:     Palpations: Abdomen is soft.     Tenderness: There is no abdominal tenderness. There is no guarding or rebound.  Musculoskeletal:        General: No swelling, tenderness or edema.  Skin:    General: Skin is warm and dry.  Neurological:     Comments: Lethargic.  Arouses to painful stimuli.  Dysarthria.  Difficulty following basic commands. Generalized weakness. Appears to be slightly weaker in the left upper and lower extremity compared to right.  Psychiatric:        Mood and Affect: Mood and affect normal.     Comments: Unable to assess     ED Results / Procedures / Treatments   Labs (all labs ordered are listed, but only abnormal results are displayed) Labs Reviewed  URINALYSIS, ROUTINE W REFLEX MICROSCOPIC - Abnormal; Notable for the following  components:      Result Value   Specific Gravity, Urine >1.046 (*)    All other components within normal limits  I-STAT ARTERIAL BLOOD GAS, ED - Abnormal; Notable for the following components:   pH, Arterial 7.265 (*)    pCO2 arterial 60.5 (*)    pO2, Arterial 332 (*)    All other components within normal limits  RESP PANEL BY RT-PCR (FLU A&B, COVID) [id number]  CULTURE, BLOOD (ROUTINE X 2)  CULTURE, BLOOD (ROUTINE X 2)  URINE CULTURE  CULTURE, RESPIRATORY  ETHANOL  PROTIME-INR  APTT  CBC  DIFFERENTIAL  COMPREHENSIVE METABOLIC PANEL  RAPID URINE DRUG SCREEN, HOSP PERFORMED  BLOOD GAS, ARTERIAL  TSH  LACTIC ACID, PLASMA  LACTIC ACID, PLASMA  BLOOD GAS, ARTERIAL  CBC  BASIC METABOLIC PANEL  LEGIONELLA PNEUMOPHILA SEROGP 1 UR AG  STREP PNEUMONIAE URINARY ANTIGEN  TRIGLYCERIDES  I-STAT CHEM 8, ED  CBG MONITORING, ED  CBG MONITORING, ED    EKG None  Radiology CT CEREBRAL PERFUSION W CONTRAST  Result Date: 07/09/2020 CLINICAL DATA:  Left-sided weakness EXAM: CT ANGIOGRAPHY HEAD AND NECK CT PERFUSION BRAIN TECHNIQUE: Multidetector CT imaging of the head and neck was performed using the standard protocol during bolus administration of intravenous contrast. Multiplanar CT image reconstructions and MIPs were obtained to evaluate the vascular anatomy. Carotid stenosis measurements (when applicable) are obtained utilizing NASCET criteria, using the distal internal carotid diameter as the denominator. Multiphase CT imaging of the brain was performed following IV bolus contrast injection. Subsequent parametric perfusion maps were calculated using RAPID software. CONTRAST:  OMNIPAQUE IOHEXOL 350 MG/ML SOLN COMPARISON:  Head CT same day FINDINGS: CTA NECK FINDINGS Aortic arch: Aortic atherosclerosis. Branching pattern is normal without origin stenosis. Right carotid system: Normal common carotid artery and bifurcation. Cervical ICA is tortuous but widely patent. Left carotid system:  Normal Vertebral arteries: Vertebral artery origins widely patent. Both vertebral arteries appear normal through the cervical region to the foramen magnum. Skeleton: Ordinary cervical spondylosis. Other neck: No mass or lymphadenopathy. Upper chest: Emphysema and pulmonary scarring. Review of the MIP images confirms the above findings CTA HEAD FINDINGS Anterior circulation: Both internal carotid arteries widely patent through the skull base and siphon regions. The anterior and middle cerebral vessels are normal. No large or medium vessel occlusion. Posterior circulation: Both vertebral arteries widely patent to the basilar. No basilar stenosis. Posterior circulation branch vessels appear normal. Venous sinuses: Patent and normal. Anatomic variants: None significant. Review of the MIP images confirms the above findings CT Brain Perfusion Findings: ASPECTS: 10 CBF (<30%) Volume:  74mL Perfusion (Tmax>6.0s) volume: 79mL Mismatch Volume: 89mL Infarction Location:None IMPRESSION: 1. Normal CT angiography of the head and neck vessels. 2. Negative perfusion study. 3. Emphysema and aortic atherosclerosis. Aortic Atherosclerosis (ICD10-I70.0) and Emphysema (ICD10-J43.9). Electronically Signed   By: Paulina Fusi M.D.   On: 07/09/2020 20:40   DG Chest Portable 1 View  Result Date: 07/09/2020 CLINICAL DATA:  Status post intubation. EXAM: PORTABLE CHEST 1 VIEW COMPARISON:  May 06, 2015 FINDINGS: An endotracheal tube is seen with its distal tip approximately 3.3 cm from the carina. The lungs are hyperinflated. Mild atelectasis and/or infiltrate is seen within the bilateral lung bases, right greater than left. There is no evidence of a pleural effusion or pneumothorax. The heart size and mediastinal contours are within normal limits. The visualized skeletal structures are unremarkable. IMPRESSION: 1. Endotracheal tube positioning, as described above. 2. Mild bibasilar atelectasis and/or infiltrate, right greater than left.  Electronically Signed   By: Aram Candela M.D.   On: 07/09/2020 20:08   DG Abd Portable 1V  Result Date: 07/09/2020 CLINICAL DATA:  OG tube placement EXAM: PORTABLE ABDOMEN - 1 VIEW COMPARISON:  07/09/2020 FINDINGS: Endotracheal tube tip is about 4.2 cm superior to the carina. Esophageal tube side-port projects over the distal esophagus. Small bilateral pleural effusions with left greater than right basilar airspace disease. Stable cardiomediastinal silhouette. No pneumothorax. Excreted contrast within the renal collecting systems. IMPRESSION: 1. Endotracheal tube tip about 4.2 cm superior to the carina. 2. Esophageal side port projects over distal esophagus, recommend further advancement by at least 10 cm for more optimal positioning. 3. Small bilateral pleural effusions and bibasilar airspace disease. Electronically Signed   By: Jasmine Pang M.D.   On: 07/09/2020 22:06   CT HEAD CODE STROKE WO CONTRAST  Result Date: 07/09/2020 CLINICAL DATA:  Code stroke.  Left-sided weakness. EXAM: CT HEAD WITHOUT CONTRAST TECHNIQUE: Contiguous axial images were obtained from the base of the skull through the vertex without intravenous contrast. COMPARISON:  10/15/2017 FINDINGS: Brain: There is some sort a of artifact in the posterior fossa. Cerebellar and lower occipital infarction can not be excluded. Otherwise, cerebral hemispheres do not show any evidence of acute infarction. Mild age related volume loss. No hemorrhage, hydrocephalus or extra-axial collection. Vascular: There is atherosclerotic calcification of the major vessels at the base of the brain. Skull: Chronic lucent area in the right frontal region, consistent with a benign finding. Sinuses/Orbits: Clear/normal Other: None ASPECTS (Alberta Stroke Program Early CT Score) - Ganglionic level infarction (caudate, lentiform nuclei, internal capsule, insula, M1-M3 cortex): 7 - Supraganglionic infarction (M4-M6 cortex): 3 Total score (0-10 with 10 being  normal): 10 IMPRESSION: 1. Some sort of artifact in the posterior fossa. Cerebellar and lower occipital infarction can not be excluded. Otherwise, no acute finding. 2. ASPECTS is 10. 3. These results were communicated to Dr. Otelia Limes at 8:10 pmon 12/17/2021by text page via the [facility] messaging system. Electronically Signed   By: Paulina Fusi M.D.   On: 07/09/2020 20:12   CT ANGIO HEAD CODE STROKE  Result Date: 07/09/2020 CLINICAL DATA:  Left-sided weakness EXAM: CT ANGIOGRAPHY HEAD AND NECK CT PERFUSION BRAIN TECHNIQUE: Multidetector CT imaging of the head and neck was performed using the standard protocol during bolus administration of intravenous contrast. Multiplanar CT image reconstructions and MIPs were obtained to evaluate the vascular anatomy. Carotid stenosis measurements (when applicable) are obtained utilizing NASCET criteria, using the distal internal carotid diameter as the denominator. Multiphase CT imaging of the brain was performed following  IV bolus contrast injection. Subsequent parametric perfusion maps were calculated using RAPID software. CONTRAST:  OMNIPAQUE IOHEXOL 350 MG/ML SOLN COMPARISON:  Head CT same day FINDINGS: CTA NECK FINDINGS Aortic arch: Aortic atherosclerosis. Branching pattern is normal without origin stenosis. Right carotid system: Normal common carotid artery and bifurcation. Cervical ICA is tortuous but widely patent. Left carotid system: Normal Vertebral arteries: Vertebral artery origins widely patent. Both vertebral arteries appear normal through the cervical region to the foramen magnum. Skeleton: Ordinary cervical spondylosis. Other neck: No mass or lymphadenopathy. Upper chest: Emphysema and pulmonary scarring. Review of the MIP images confirms the above findings CTA HEAD FINDINGS Anterior circulation: Both internal carotid arteries widely patent through the skull base and siphon regions. The anterior and middle cerebral vessels are normal. No large or medium  vessel occlusion. Posterior circulation: Both vertebral arteries widely patent to the basilar. No basilar stenosis. Posterior circulation branch vessels appear normal. Venous sinuses: Patent and normal. Anatomic variants: None significant. Review of the MIP images confirms the above findings CT Brain Perfusion Findings: ASPECTS: 10 CBF (<30%) Volume: 0mL Perfusion (Tmax>6.0s) volume: 0mL Mismatch Volume: 0mL Infarction Location:None IMPRESSION: 1. Normal CT angiography of the head and neck vessels. 2. Negative perfusion study. 3. Emphysema and aortic atherosclerosis. Aortic Atherosclerosis (ICD10-I70.0) and Emphysema (ICD10-J43.9). Electronically Signed   By: Paulina Fusi M.D.   On: 07/09/2020 20:40   CT ANGIO NECK CODE STROKE  Result Date: 07/09/2020 CLINICAL DATA:  Left-sided weakness EXAM: CT ANGIOGRAPHY HEAD AND NECK CT PERFUSION BRAIN TECHNIQUE: Multidetector CT imaging of the head and neck was performed using the standard protocol during bolus administration of intravenous contrast. Multiplanar CT image reconstructions and MIPs were obtained to evaluate the vascular anatomy. Carotid stenosis measurements (when applicable) are obtained utilizing NASCET criteria, using the distal internal carotid diameter as the denominator. Multiphase CT imaging of the brain was performed following IV bolus contrast injection. Subsequent parametric perfusion maps were calculated using RAPID software. CONTRAST:  OMNIPAQUE IOHEXOL 350 MG/ML SOLN COMPARISON:  Head CT same day FINDINGS: CTA NECK FINDINGS Aortic arch: Aortic atherosclerosis. Branching pattern is normal without origin stenosis. Right carotid system: Normal common carotid artery and bifurcation. Cervical ICA is tortuous but widely patent. Left carotid system: Normal Vertebral arteries: Vertebral artery origins widely patent. Both vertebral arteries appear normal through the cervical region to the foramen magnum. Skeleton: Ordinary cervical spondylosis.  Other neck: No mass or lymphadenopathy. Upper chest: Emphysema and pulmonary scarring. Review of the MIP images confirms the above findings CTA HEAD FINDINGS Anterior circulation: Both internal carotid arteries widely patent through the skull base and siphon regions. The anterior and middle cerebral vessels are normal. No large or medium vessel occlusion. Posterior circulation: Both vertebral arteries widely patent to the basilar. No basilar stenosis. Posterior circulation branch vessels appear normal. Venous sinuses: Patent and normal. Anatomic variants: None significant. Review of the MIP images confirms the above findings CT Brain Perfusion Findings: ASPECTS: 10 CBF (<30%) Volume: 0mL Perfusion (Tmax>6.0s) volume: 0mL Mismatch Volume: 0mL Infarction Location:None IMPRESSION: 1. Normal CT angiography of the head and neck vessels. 2. Negative perfusion study. 3. Emphysema and aortic atherosclerosis. Aortic Atherosclerosis (ICD10-I70.0) and Emphysema (ICD10-J43.9). Electronically Signed   By: Paulina Fusi M.D.   On: 07/09/2020 20:40    Procedures Procedure Name: Intubation Date/Time: 07/09/2020 7:56 PM Performed by: Tilden Fossa, MD Pre-anesthesia Checklist: Patient identified, Patient being monitored, Emergency Drugs available, Timeout performed and Suction available Oxygen Delivery Method: Non-rebreather mask Preoxygenation: Pre-oxygenation with 100% oxygen  Induction Type: Rapid sequence Ventilation: Mask ventilation without difficulty Laryngoscope Size: Glidescope Tube size: 8.0 mm Number of attempts: 1 Placement Confirmation: ETT inserted through vocal cords under direct vision,  CO2 detector and Breath sounds checked- equal and bilateral Secured at: 26 cm Tube secured with: ETT holder Dental Injury: Teeth and Oropharynx as per pre-operative assessment       (including critical care time) CRITICAL CARE Performed by: Tilden FossaElizabeth Lavina Resor   Total critical care time: 45 minutes  Critical  care time was exclusive of separately billable procedures and treating other patients.  Critical care was necessary to treat or prevent imminent or life-threatening deterioration.  Critical care was time spent personally by me on the following activities: development of treatment plan with patient and/or surrogate as well as nursing, discussions with consultants, evaluation of patient's response to treatment, examination of patient, obtaining history from patient or surrogate, ordering and performing treatments and interventions, ordering and review of laboratory studies, ordering and review of radiographic studies, pulse oximetry and re-evaluation of patient's condition.  Medications Ordered in ED Medications  norepinephrine (LEVOPHED) 4mg  in 250mL premix infusion (2 mcg/min Intravenous Rate/Dose Change 07/09/20 2136)  docusate sodium (COLACE) capsule 100 mg (has no administration in time range)  polyethylene glycol (MIRALAX / GLYCOLAX) packet 17 g (has no administration in time range)  enoxaparin (LOVENOX) injection 40 mg (40 mg Subcutaneous Given 07/09/20 2350)  pantoprazole (PROTONIX) EC tablet 40 mg (has no administration in time range)  azithromycin (ZITHROMAX) 500 mg in sodium chloride 0.9 % 250 mL IVPB (has no administration in time range)  insulin aspart (novoLOG) injection 2-6 Units (has no administration in time range)  docusate (COLACE) 50 MG/5ML liquid 100 mg (has no administration in time range)  polyethylene glycol (MIRALAX / GLYCOLAX) packet 17 g (has no administration in time range)  fentaNYL 2500mcg in NS 250mL (5710mcg/ml) infusion-PREMIX (75 mcg/hr Intravenous New Bag/Given 07/09/20 2333)  fentaNYL (SUBLIMAZE) bolus via infusion 25 mcg (has no administration in time range)  propofol (DIPRIVAN) 1000 MG/100ML infusion (30 mcg/kg/min  87.4 kg Intravenous New Bag/Given 07/09/20 2334)  ceFEPIme (MAXIPIME) 2 g in sodium chloride 0.9 % 100 mL IVPB (0 g Intravenous Stopped 07/09/20  2343)  vancomycin (VANCOREADY) IVPB 1750 mg/350 mL (1,750 mg Intravenous New Bag/Given 07/09/20 2352)    Followed by  vancomycin (VANCOCIN) IVPB 1000 mg/200 mL premix (has no administration in time range)  ipratropium-albuterol (DUONEB) 0.5-2.5 (3) MG/3ML nebulizer solution 3 mL (has no administration in time range)  etomidate (AMIDATE) injection (20 mg Intravenous Given 07/09/20 1937)  succinylcholine (ANECTINE) injection (100 mg Intravenous Given 07/09/20 1938)  iohexol (OMNIPAQUE) 350 MG/ML injection 100 mL (100 mLs Intravenous Contrast Given 07/09/20 2013)  propofol (DIPRIVAN) 1000 MG/100ML infusion ( Intravenous Stopped 07/09/20 2334)  fentaNYL (SUBLIMAZE) injection ( Intravenous Canceled Entry 07/09/20 2045)  fentaNYL (SUBLIMAZE) injection 25 mcg (25 mcg Intravenous Bolus 07/09/20 2330)    ED Course  I have reviewed the triage vital signs and the nursing notes.  Pertinent labs & imaging results that were available during my care of the patient were reviewed by me and considered in my medical decision making (see chart for details).    MDM Rules/Calculators/A&P                         patient presented to the emergency department as a code stroke. On ED arrival he was evaluated by neurology and eventually transferred to radiology for CT scan. On attempt to  lay the patient supine he developed significant increased work of breathing. Did not feel that he could tolerate imaging and he was transferred back to the room for airway securement prior to advanced imaging. He was then transferred back to radiology for CT head and CTA, which was negative for large vessel occlusion. Patient did develop transient hypotension following administration of fentanyl for sedation. He was given a fluid bolus and started on levophed for blood pressure support. Will out on lactate, blood cultures and treat with antibiotics empirically for possible infection. Critical care consulted for admission for ongoing  treatment. Patient sister updated of findings of studies.  Final Clinical Impression(s) / ED Diagnoses Final diagnoses:  Acute respiratory failure with hypoxia and hypercapnia (HCC)  Altered mental status, unspecified altered mental status type    Rx / DC Orders ED Discharge Orders    None       Tilden Fossa, MD 07/10/20 0006

## 2020-07-09 NOTE — Plan of Care (Signed)
Wedding band removed in the ED and given to his sister at bedside.  Steffanie Dunn, DO 07/09/20 10:26 PM [facility]

## 2020-07-10 ENCOUNTER — Inpatient Hospital Stay (HOSPITAL_COMMUNITY): Payer: Medicare HMO

## 2020-07-10 DIAGNOSIS — R4182 Altered mental status, unspecified: Secondary | ICD-10-CM

## 2020-07-10 DIAGNOSIS — I340 Nonrheumatic mitral (valve) insufficiency: Secondary | ICD-10-CM

## 2020-07-10 DIAGNOSIS — J9601 Acute respiratory failure with hypoxia: Secondary | ICD-10-CM

## 2020-07-10 DIAGNOSIS — J9602 Acute respiratory failure with hypercapnia: Secondary | ICD-10-CM

## 2020-07-10 LAB — GLUCOSE, CAPILLARY
Glucose-Capillary: 79 mg/dL (ref 70–99)
Glucose-Capillary: 84 mg/dL (ref 70–99)
Glucose-Capillary: 69 mg/dL — ABNORMAL LOW (ref 70–99)
Glucose-Capillary: 115 mg/dL — ABNORMAL HIGH (ref 70–99)

## 2020-07-10 LAB — LACTIC ACID, PLASMA
Lactic Acid, Venous: 1.4 mmol/L (ref 0.5–1.9)
Lactic Acid, Venous: 1.9 mmol/L (ref 0.5–1.9)

## 2020-07-10 LAB — ECHOCARDIOGRAM COMPLETE
Area-P 1/2: 3.16 cm2
Height: 72 in
S' Lateral: 2.6 cm
Weight: 3061.75 oz

## 2020-07-10 LAB — CBC
HCT: 42 % (ref 39.0–52.0)
Hemoglobin: 13 g/dL (ref 13.0–17.0)
MCH: 29.7 pg (ref 26.0–34.0)
MCHC: 31 g/dL (ref 30.0–36.0)
MCV: 96.1 fL (ref 80.0–100.0)
Platelets: 161 10*3/uL (ref 150–400)
RBC: 4.37 MIL/uL (ref 4.22–5.81)
RDW: 13.2 % (ref 11.5–15.5)
WBC: 7 10*3/uL (ref 4.0–10.5)
nRBC: 0 % (ref 0.0–0.2)

## 2020-07-10 LAB — BASIC METABOLIC PANEL
Anion gap: 10 (ref 5–15)
BUN: 17 mg/dL (ref 8–23)
CO2: 23 mmol/L (ref 22–32)
Calcium: 8.5 mg/dL — ABNORMAL LOW (ref 8.9–10.3)
Chloride: 105 mmol/L (ref 98–111)
Creatinine, Ser: 1 mg/dL (ref 0.61–1.24)
GFR, Estimated: >60 mL/min (ref >60)
Glucose, Bld: 91 mg/dL (ref 70–99)
Potassium: 4.5 mmol/L (ref 3.5–5.1)
Sodium: 138 mmol/L (ref 135–145)

## 2020-07-10 LAB — POCT I-STAT 7, (LYTES, BLD GAS, ICA,H+H)
Acid-base deficit: 2 mmol/L (ref 0.0–2.0)
Bicarbonate: 23.5 mmol/L (ref 20.0–28.0)
Calcium, Ion: 1.21 mmol/L (ref 1.15–1.40)
HCT: 42 % (ref 39.0–52.0)
Hemoglobin: 14.3 g/dL (ref 13.0–17.0)
O2 Saturation: 95 %
Patient temperature: 97.8
Potassium: 3.2 mmol/L — ABNORMAL LOW (ref 3.5–5.1)
Sodium: 139 mmol/L (ref 135–145)
TCO2: 25 mmol/L (ref 22–32)
pCO2 arterial: 39.8 mmHg (ref 32.0–48.0)
pH, Arterial: 7.378 (ref 7.350–7.450)
pO2, Arterial: 74 mmHg — ABNORMAL LOW (ref 83.0–108.0)

## 2020-07-10 LAB — TSH: TSH: 5.855 u[IU]/mL — ABNORMAL HIGH (ref 0.350–4.500)

## 2020-07-10 LAB — TRIGLYCERIDES: Triglycerides: 86 mg/dL (ref <150)

## 2020-07-10 LAB — MRSA PCR SCREENING: MRSA by PCR: NEGATIVE

## 2020-07-10 LAB — STREP PNEUMONIAE URINARY ANTIGEN: Strep Pneumo Urinary Antigen: NEGATIVE

## 2020-07-10 MED ORDER — ASPIRIN 325 MG PO TABS
325.0000 mg | ORAL_TABLET | Freq: Every day | ORAL | Status: DC
  Administered 2020-07-11 – 2020-07-13 (×3): 325 mg via ORAL
  Filled 2020-07-10 (×3): qty 1

## 2020-07-10 MED ORDER — CHLORHEXIDINE GLUCONATE CLOTH 2 % EX PADS
6.0000 | MEDICATED_PAD | Freq: Every day | CUTANEOUS | Status: DC
  Administered 2020-07-10 – 2020-07-13 (×4): 6 via TOPICAL

## 2020-07-10 MED ORDER — ATROPINE SULFATE 1 MG/10ML IJ SOSY
PREFILLED_SYRINGE | INTRAMUSCULAR | Status: AC
  Filled 2020-07-10: qty 10

## 2020-07-10 MED ORDER — POLYETHYLENE GLYCOL 3350 17 G PO PACK
17.0000 g | PACK | Freq: Every day | ORAL | Status: DC
  Filled 2020-07-10: qty 1

## 2020-07-10 MED ORDER — DEXTROSE 50 % IV SOLN
INTRAVENOUS | Status: AC
  Administered 2020-07-10: 08:00:00 25 mL
  Filled 2020-07-10: qty 50

## 2020-07-10 MED ORDER — CHLORHEXIDINE GLUCONATE 0.12% ORAL RINSE (MEDLINE KIT)
15.0000 mL | Freq: Two times a day (BID) | OROMUCOSAL | Status: DC
  Administered 2020-07-10 – 2020-07-13 (×6): 15 mL via OROMUCOSAL

## 2020-07-10 MED ORDER — DEXTROSE 50 % IV SOLN
INTRAVENOUS | Status: AC
  Filled 2020-07-10: qty 50

## 2020-07-10 MED ORDER — DOCUSATE SODIUM 100 MG PO CAPS
100.0000 mg | ORAL_CAPSULE | Freq: Two times a day (BID) | ORAL | Status: DC
  Administered 2020-07-10 – 2020-07-13 (×4): 100 mg via ORAL
  Filled 2020-07-10 (×5): qty 1

## 2020-07-10 MED ORDER — ASPIRIN 325 MG PO TABS: 325.0000 mg | ORAL_TABLET | Freq: Every day | ORAL | Status: DC

## 2020-07-10 MED ORDER — ORAL CARE MOUTH RINSE
15.0000 mL | OROMUCOSAL | Status: DC
  Administered 2020-07-10 (×7): 15 mL via OROMUCOSAL

## 2020-07-10 NOTE — Evaluation (Signed)
Clinical/Bedside Swallow Evaluation Patient Details  Name: JAMEIL WHITMOYER MRN: [id number] Date of Birth: 08/19/1940  Today's Date: 07/10/2020 Time: SLP Start Time (ACUTE ONLY): 1110 SLP Stop Time (ACUTE ONLY): 1119 SLP Time Calculation (min) (ACUTE ONLY): 9 min  Past Medical History:  Past Medical History:  Diagnosis Date  . COPD (chronic obstructive pulmonary disease) (HCC)    Past Surgical History:  Past Surgical History:  Procedure Laterality Date  . INGUINAL HERNIA REPAIR    . ROTATOR CUFF REPAIR     HPI:  Mr. Donaghue is a 79 y/o gentleman who was brought to the ED after sudden onset leg weakness, first in his left leg then his right. History obtained from sister at bedside in the ED, EDP, and chart review. He had left facial droop and mildly slurred speech, which per his sister slurred speech is his baseline. He had weakness in all extremities upon presentation and had progressive encephalopathy. developing somnolence and snoring with likely obstructive apneas when he was laid flat in the ED. He was intubated for airway protection. CT perfusion study did not demonstrate a CVA. CXR 12/18 concerning for BLL infitrates. Pt has hx of esophageal dysphagia, s/p dilation, takes omeprazole.   Assessment / Plan / Recommendation Clinical Impression  Pt presents with mild risk of aspiration in setting of recent VDRF.  Pt has hx of esophageal strictures with dilation per report, but denies difficulty swallowing PTA.  Pt was able to phonate and volitional cough was seemingly strong, but pt does state that his voice is hoarse and he sound like he "is in a well."  Pt tolerated thin liquid and puree with no clincal s/s of aspiration.  With cracker there was a single extremely delayed dry cough, which pt attributed to a tickle.  Pt's CXR is concerning for BLL infiltrates.  Recommend MBSS to rule out silent aspiration; however, given pt's clinical presentation and only brief intubation, appears safe to  initiate a PO diet pending results of instrumental evaluation.  MBSS planned for 12/18 based on radiology availability.  Recommend regular texture diet with thin liquid.  SLP Visit Diagnosis: Dysphagia, unspecified (R13.10)    Aspiration Risk  Mild aspiration risk    Diet Recommendation Regular;Thin liquid   Liquid Administration via: Cup;Straw Medication Administration: Whole meds with liquid Supervision: Patient able to self feed Compensations: Slow rate;Small sips/bites Postural Changes: Seated upright at 90 degrees    Other  Recommendations Oral Care Recommendations: Oral care BID   Follow up Recommendations  (TBD)      Frequency and Duration  (TBD)          Prognosis Prognosis for Safe Diet Advancement:  (TBD)      Swallow Study   General Date of Onset: 07/09/20 HPI: Mr. Proch is a 79 y/o gentleman who was brought to the ED after sudden onset leg weakness, first in his left leg then his right. History obtained from sister at bedside in the ED, EDP, and chart review. He had left facial droop and mildly slurred speech, which per his sister slurred speech is his baseline. He had weakness in all extremities upon presentation and had progressive encephalopathy. developing somnolence and snoring with likely obstructive apneas when he was laid flat in the ED. He was intubated for airway protection. CT perfusion study did not demonstrate a CVA. CXR 12/18 concerning for BLL infitrates. Pt has hx of esophageal dysphagia, s/p dilation, takes omeprazole. Type of Study: Bedside Swallow Evaluation Previous Swallow Assessment: none Diet Prior  to this Study: NPO Temperature Spikes Noted: No Respiratory Status: Nasal cannula History of Recent Intubation: Yes Length of Intubations (days): 1 days Date extubated: 07/10/20 Behavior/Cognition: Alert;Cooperative;Pleasant mood Oral Cavity Assessment: Within Functional Limits Oral Care Completed by SLP: No Oral Cavity - Dentition: Adequate  natural dentition Self-Feeding Abilities: Able to feed self Patient Positioning: Upright in bed Baseline Vocal Quality: Normal;Hoarse Volitional Cough: Strong Volitional Swallow: Able to elicit    Oral/Motor/Sensory Function Overall Oral Motor/Sensory Function: Within functional limits Facial ROM: Within Functional Limits Lingual ROM: Within Functional Limits Lingual Symmetry: Within Functional Limits Lingual Strength: Within Functional Limits Velum: Within Functional Limits Mandible: Within Functional Limits   Ice Chips Ice chips: Not tested   Thin Liquid Thin Liquid: Within functional limits Presentation: Cup;Straw    Nectar Thick Nectar Thick Liquid: Not tested   Honey Thick Honey Thick Liquid: Not tested   Puree Puree: Within functional limits Presentation: Spoon   Solid     Solid: Within functional limits Presentation: Self Fed Pharyngeal Phase Impairments: Cough - Delayed      Kerrie Pleasure, MA, CCC-SLP Acute Rehabilitation Services Office: [phone]; Pager (12/18): [phone] 07/10/2020,12:08 PM

## 2020-07-10 NOTE — Progress Notes (Signed)
eLink Physician-Brief Progress Note Patient Name: Kyle Payne DOB: [birth year] MRN: [id number]   Date of Service  07/10/2020  HPI/Events of Note  Patient admitted with altered mental status and initial lateralizing signs prompting  Code stroke, followed by a non focal exam, progressive respiratory failure necessitating intubation and mechanical ventilation, work up is in progress and neurology  is following.                                   eICU Interventions  New Patient Evaluation completed.        [name] 07/10/2020, 2:41 AM

## 2020-07-10 NOTE — Progress Notes (Signed)
RT transported pt from ED room 31 to 3M02 without complication. Pts respiratory status remained stable throughout transport. RT will continue to monitor.

## 2020-07-10 NOTE — Progress Notes (Signed)
*  PRELIMINARY RESULTS* Echocardiogram 2D Echocardiogram has been performed.  Kyle Payne 07/10/2020, 1:14 PM

## 2020-07-10 NOTE — Progress Notes (Signed)
eLink Physician-Brief Progress Note Patient Name: Kyle Payne DOB: [birth year] MRN: [id number]   Date of Service  07/10/2020  HPI/Events of Note  12 lead EKG reviewed, sinus bradycardia, RBBB, LAFB, no acute ischemic findings.  eICU Interventions  No intervention.        [name] 07/10/2020, 4:27 AM

## 2020-07-10 NOTE — Progress Notes (Addendum)
eLink Physician-Brief Progress Note Patient Name: Kyle Payne DOB: Feb 26, 1941 MRN: [id number]   Date of Service  07/10/2020  HPI/Events of Note  Patient dropped his blood pressure while on Propofol and Fentanyl infusions but blood pressure and heart rate have normalized off the sedation.  eICU Interventions  Bedside RN instructed to resume Fentanyl but continue to hold Propofol infusion for now. Current vital signs: BP 138/74, HR 62, patient remains sedated.        Thomasene Lot Aziya Arena 07/10/2020, 3:55 AM

## 2020-07-10 NOTE — Progress Notes (Signed)
STROKE TEAM PROGRESS NOTE   HISTORY OF PRESENT ILLNESS (per record) Kyle Payne is a 79 y.o. male with a history of COPD presenting with acute onset of respiratory distress in conjunction with dysarthria and left sided weakness. Per EMS, his sats dropped to the low 80's on arrival but were brought up to low 90's on NRB. BP 160/90, HR 80 and CBG 167 in the field. Per EMS, he was consistently weak on the left, with LUE quickly dropping to his side after he lifted it and with weak LLE when EMS tried to walk him. He was also dysarthric. No seizure like activity was reported by wife. On arrival the patient was somnolent with sonorous respirations that became stridorous when laid supine. Decision was made to intubate.  Prior to intubation, the patient's neurological exam revealed diffuse limb weakness and fatigueability but no asymmetry in strength or responses to sensory stimuli.  He takes ASA at home. He is not on an anticoagulant.  On limited ROS he denies headache, CP and N/V.  LSN: [id number] tPA Given: No: No lateralizing deficits on exam.  NIHSS: 12    INTERVAL HISTORY His family is not at bedside.  Nursing is at bedside.  CT was negative for acute event.  Patient is intubated, Covid negative, following commands, weaning plan to extubate today, nodding appropriately, no apparent focal weakness.  Pending MRI of the brain.    OBJECTIVE Vitals:   07/10/20 0500 07/10/20 0600 07/10/20 0700 07/10/20 0800  BP: 121/85 127/77 119/81 104/63  Pulse: 63 64 64 79  Resp: 20 20 20 11   Temp:      TempSrc:      SpO2: 97% 97% 97% 92%  Weight:      Height:        CBC:  Recent Labs  Lab 07/09/20 1925 07/09/20 1928 07/10/20 0119 07/10/20 0527  WBC 5.8  --  7.0  --   NEUTROABS 3.2  --   --   --   HGB 14.2   < > 13.0 14.3  HCT 45.3   < > 42.0 42.0  MCV 94.8  --  96.1  --   PLT 193  --  161  --    < > = values in this interval not displayed.    Basic Metabolic Panel:  Recent Labs  Lab  07/09/20 1925 07/09/20 1928 07/09/20 2145 07/10/20 0119 07/10/20 0527  NA 138 138   < > 138 139  K 3.8 3.9   < > 4.5 3.2*  CL 99 100  --  105  --   CO2 27  --   --  23  --   GLUCOSE 97 94  --  91  --   BUN 19 23  --  17  --   CREATININE 1.09 1.00  --  1.00  --   CALCIUM 9.6  --   --  8.5*  --    < > = values in this interval not displayed.    Lipid Panel:     Component Value Date/Time   CHOL 252 (H) 01/07/2018 1040   TRIG 86 07/10/2020 0119   HDL 45 01/07/2018 1040   CHOLHDL 5.6 (H) 01/07/2018 1040   LDLCALC 180 (H) 01/07/2018 1040   HgbA1c: No results found for: HGBA1C Urine Drug Screen:     Component Value Date/Time   LABOPIA NONE DETECTED 07/09/2020 2146   COCAINSCRNUR NONE DETECTED 07/09/2020 2146   LABBENZ NONE DETECTED 07/09/2020 2146  AMPHETMU NONE DETECTED 07/09/2020 2146   THCU NONE DETECTED 07/09/2020 2146   LABBARB NONE DETECTED 07/09/2020 2146    Alcohol Level     Component Value Date/Time   ETH <10 07/09/2020 1925    IMAGING  CT ANGIO HEAD CODE STROKE CT ANGIO NECK CODE STROKE CT CEREBRAL PERFUSION W CONTRAST 07/09/2020 IMPRESSION:  1. Normal CT angiography of the head and neck vessels.  2. Negative perfusion study.  3. Emphysema and aortic atherosclerosis.  Aortic Atherosclerosis (ICD10-I70.0) and Emphysema (ICD10-J43.9).   MRI BRAIN WO CONTRAST - pending  CT HEAD CODE STROKE WO CONTRAST 07/09/2020 IMPRESSION:  1. Some sort of artifact in the posterior fossa. Cerebellar and lower occipital infarction can not be excluded. Otherwise, no acute finding.  2. ASPECTS is 10.   DG Chest Portable 1 View 07/09/2020 IMPRESSION:  1. Endotracheal tube positioning, as described above.  2. Mild bibasilar atelectasis and/or infiltrate, right greater than left.  DG Abd Portable 1V 07/09/2020 IMPRESSION:  1. Endotracheal tube tip about 4.2 cm superior to the carina.  2. Esophageal side port projects over distal esophagus, recommend further  advancement by at least 10 cm for more optimal positioning.  3. Small bilateral pleural effusions and bibasilar airspace disease  Transthoracic Echocardiogram  00/00/2021 Pending  ECG - SB rate 47 BPM. (See cardiology reading for complete details)  PHYSICAL EXAM Blood pressure 104/63, pulse 79, temperature (!) 96.3 F (35.7 C), temperature source Axillary, resp. rate 11, height (S) 6' (1.829 m), weight 86.8 kg, SpO2 92 %.   Patient is laying in bed, intubated, no acute distress, he is awake and alert, following commands bilaterally, nods appropriately to questions, pupils equally round and reactive to light, blinks bilaterally to threat visual fields intact, difficult to assess facial symmetry due to ET tube, patient is restrained but no focal deficits apparent, he is able to raise both arms and both legs without drift, no asymmetry noted, grip intact, withdraws in all extremities, reflexes symmetric, toes equivocal, not able to test gait or coordination.       ASSESSMENT/PLAN Kyle Payne is a 79 y.o. male with history of history of COPD presenting with acute onset of respiratory distress in conjunction with dysarthria, left sided weakness and somnolence. Prior to intubation in ED neurological exam revealed diffuse limb weakness and fatigueability but no asymmetry in strength or responses to sensory stimuli. He did not receive IV t-PA due to no lateralizing deficits on exam.   Stroke: MRI pending, no focal neuro deficits appreciated  Code Stroke CT Head - Some sort of artifact in the posterior fossa. Cerebellar and lower occipital infarction can not be excluded. Otherwise, no acute finding. ASPECTS is 10.   CT head - not ordered  MRI head - pending;   MRA head - not ordered  CTA H&N - Normal CT angiography of the head and neck vessels.   CT Perfusion - normal  Carotid Doppler - CTA neck ordered - carotid dopplers not indicated.  2D Echo - pending  Loyal Jacobson Virus 2 -  negative  LDL - will order - pending  HgbA1c - will order - pending  UDS - negative  VTE prophylaxis - Lovenox Diet  Diet Order            Diet NPO time specified  Diet effective now                 aspirin 325 mg daily prior to admission, now on No antithrombotic Per Dr  Lindzen's note - continue ASA 325 mg daily (pt has gastric tube)  Patient will be counseled to be compliant with his antithrombotic medications  Ongoing aggressive stroke risk factor management  Therapy recommendations:  pending  Disposition:  Pending  Hypertension  Home BP meds: none   Current BP meds: Levophed  BP mildly low at times . Permissive hypertension (OK if < 220/120) but gradually normalize in 5-7 days  . Long-term BP goal normotensive  Hyperlipidemia  Home Lipid lowering medication: none   LDL pending, goal < 70  Current lipid lowering medication: none / NPO   Continue statin at discharge  Other Stroke Risk Factors  Advanced age  Former cigarette smoker - quit  Family hx stroke (father)   Other Active Problems, Findings and Recommendations  Code status - Full code  Aortic Atherosclerosis (ICD10-I70.0)  Emphysema (ICD10-J43.9).   Empiric antibiotics for severe CAP - Cefopime ; Zithromax ; Vancomycin  Hypokalemia - potassium 3.2 - defer to [facility]  Bradycardia  Hospital day # 1  Patient without any focal neuro deficits.  CT of the head was negative.  Pending MRI of the brain and echo, if these are negative stroke will sign off.  Personally examined patient and images, and have participated in and made any corrections needed to history, physical, neuro exam,assessment and plan as stated above.  I have personally obtained the history, evaluated lab date, reviewed imaging studies and agree with radiology interpretations.    Naomie Dean, MD Stroke Neurology  I spent 35 minutes of face-to-face and non-face-to-face time with patient. This included prechart review,  lab review, study review, order entry, electronic health record documentation, patient education on the different diagnostic and therapeutic options, counseling and coordination of care, risks and benefits of management, compliance, or risk factor reduction   To contact Stroke Continuity provider, please refer to [email or web address]. After hours, contact General Neurology

## 2020-07-10 NOTE — Progress Notes (Signed)
Kyle Payne, MRN:  [id number], DOB:  January 30, 1941, LOS: 1 ADMISSION DATE:  07/09/2020, CONSULTATION DATE:  12/17 REFERRING MD:  Madilyn Hook- ED, CHIEF COMPLAINT:  Acute respiratory failure   Brief History:  Acute onset weakness, then progressive encephalopathy, sonorous respirations. Intubated in the ED for airway protection to facilitate stroke workup.  History of Present Illness:  [name] is a 79 y/o gentleman who was brought to the ED after sudden onset leg weakness, first in his left leg then his right. History obtained from sister at bedside in the ED, EDP, and chart review. He had left facial droop and mildly slurred speech, which per his sister slurred speech is his baseline. He had weakness in all extremities upon presentation and had progressive encephalopathy. developing somnolence and snoring with likely obstructive apneas when he was laid flat in the ED. He was intubated for airway protection. CT perfusion study did not demonstrate a CVA. He had transient hypotension due to sedation responsive to short-term pressors and IVF. Per sister at bedside he has not had new complaints recently. No new medications that she is aware of. Vaccinated and boosted for covid. His sister thinks he is UTD on flu vaccine as well.  Past Medical History:  COPD - no PFTs, no PTA inhalers Vasovagal syncope OSA- not on CPAP? HLD  Significant Hospital Events:  Admitted, intubated 12/17>  Consults:  Neuro  Procedures:  ETT 12/17>  Significant Diagnostic Tests:  CT brain 12/17>> 1. Some sort of artifact in the posterior fossa. Cerebellar and lower occipital infarction can not be excluded. Otherwise, no acute finding. 2. ASPECTS is 10.  CTA head/ neck/  Perfusion 12/17 >> 1. Normal CT angiography of the head and neck vessels. 2. Negative perfusion study. 3. Emphysema and aortic atherosclerosis. Aortic Atherosclerosis and Emphysema   Micro Data:  12/17 Covid negative/ Flu A & B  negative 12/17 UC  >> 12/17 BCX 2 >> 12/17 MRSA PCR >> neg 12/17 trach asp >>  Antimicrobials:  vanc 12/17> Pip-tazo 12/17 Azithromycin 12/17> 12/18 cefepime >>  Interim History / Subjective:  Hypoglycemic event this morning Hypothermic down to 94.6 overnight  Off NE and sedation  Moving all extremities, f/c, and writing to communicate Weaning well on PSV 5/5 since 0730  Wife unable to come to hospital by herself given mobility; sister remains at bedside.   Objective   Blood pressure 104/63, pulse 79, temperature (!) 96.3 F (35.7 C), temperature source Axillary, resp. rate 11, height (S) 6' (1.829 m), weight 86.8 kg, SpO2 92 %.    Vent Mode: PRVC FiO2 (%):  [40 %-100 %] 40 % Set Rate:  [16 bmp-20 bmp] 20 bmp Vt Set:  [620 mL] 620 mL PEEP:  [5 cmH20] 5 cmH20 Plateau Pressure:  [9 cmH20-19 cmH20] 19 cmH20   Intake/Output Summary (Last 24 hours) at 07/10/2020 0824 Last data filed at 07/10/2020 0800 Gross per 24 hour  Intake 909.05 ml  Output 700 ml  Net 209.05 ml   Filed Weights   07/09/20 1934 07/10/20 0039 07/10/20 0405  Weight: 87.4 kg 86.8 kg 86.8 kg    Examination: General:  Elderly thin male sitting upright in bed in NAD, writing to communicate HEENT: MM pink/moist, ETT/ OGT, pupils 4/reactive, anicteric  Neuro: Alert, f/c, MAE- no obvious focal weakness, no arm drift CV: rr, no murmur PULM:  Non labored on PSV 5/5, great TV, most clear throughout, faint exp wheeze right upper lobe, no secretions  GI: soft, NT, ND, +  active, foley- cyu  Extremities: warm/dry, no LE edema  Skin: no rashes  CXR personally reviewed> ett stable, R pleural effusion, bibasilar infiltrates   Resolved Hospital Problem list     Assessment & Plan:  Acute respiratory failure with hypoxia and hypercapnia; unclear if this was precipitated by mental status changes or bibasilar CAP vs aspiration Emphysema on CT scan. P:  Successful SBT this am, extubate now Supplemental O2 prn   NPO prior to speech eval Ongoing aggressive pulmonary hygiene  Continue  BD q 6 and prn  OT/ PT/ SLP  Follow culture data Continue cefepime and azithro for severe CAP for now x 5 days Urinary legionella pending; urinary pneumococcus antigen negative   Diffuse weakness at presentation, acute onset, since resolved  Acute encephalopathy, potentially due to hypercapnia vs sepsis vs less likely CVA/ possibly TIA. UA not suggestive of UTI. On high dose gabapentin at home, but this is a long-term med and renal function is stable.  - Neurology following - pending MRI brain and TTE - UDS / ETOH neg - sepsis treatment as above - TSH 5.855 - ongoing neuro checks  - continue ASA    Hypoglycemia  - resolved, continue to monitor closely - d/c SSI    Best practice (evaluated daily)  Diet: NPO Pain/Anxiety/Delirium protocol (if indicated): d/c VAP protocol (if indicated): yes DVT prophylaxis: lovenox GI prophylaxis: pantoprazole Glucose control: SSI Mobility: progressive Disposition: ICU  Goals of Care:  Last date of multidisciplinary goals of care discussion:  Family and staff present:   Summary of discussion:   Follow up goals of care discussion due: 12/18 Code Status: full  Labs   CBC: Recent Labs  Lab 07/09/20 1925 07/09/20 1928 07/09/20 2145 07/10/20 0119 07/10/20 0527  WBC 5.8  --   --  7.0  --   NEUTROABS 3.2  --   --   --   --   HGB 14.2 15.6 13.6 13.0 14.3  HCT 45.3 46.0 40.0 42.0 42.0  MCV 94.8  --   --  96.1  --   PLT 193  --   --  161  --     Basic Metabolic Panel: Recent Labs  Lab 07/09/20 1925 07/09/20 1928 07/09/20 2145 07/10/20 0119 07/10/20 0527  NA 138 138 137 138 139  K 3.8 3.9 4.0 4.5 3.2*  CL 99 100  --  105  --   CO2 27  --   --  23  --   GLUCOSE 97 94  --  91  --   BUN 19 23  --  17  --   CREATININE 1.09 1.00  --  1.00  --   CALCIUM 9.6  --   --  8.5*  --    GFR: Estimated Creatinine Clearance: 65.7 mL/min (by C-G formula based on  SCr of 1 mg/dL). Recent Labs  Lab 07/09/20 1925 07/09/20 2335 07/10/20 0119  WBC 5.8  --  7.0  LATICACIDVEN  --  1.4 1.9    Liver Function Tests: Recent Labs  Lab 07/09/20 1925  AST 22  ALT 21  ALKPHOS 49  BILITOT 0.6  PROT 7.1  ALBUMIN 4.1   No results for input(s): LIPASE, AMYLASE in the last 168 hours. No results for input(s): AMMONIA in the last 168 hours.  ABG    Component Value Date/Time   PHART 7.378 07/10/2020 0527   PCO2ART 39.8 07/10/2020 0527   PO2ART 74 (L) 07/10/2020 0527   HCO3 23.5 07/10/2020 0527  TCO2 25 07/10/2020 0527   ACIDBASEDEF 2.0 07/10/2020 0527   O2SAT 95.0 07/10/2020 0527     Coagulation Profile: Recent Labs  Lab 07/09/20 1925  INR 1.0    Cardiac Enzymes: No results for input(s): CKTOTAL, CKMB, CKMBINDEX, TROPONINI in the last 168 hours.  HbA1C: No results found for: HGBA1C  CBG: Recent Labs  Lab 07/09/20 1923 07/09/20 1943 07/10/20 0152 07/10/20 0326 07/10/20 0732  GLUCAP 88 93 79 84 69*    Critical care time: 35 min.     Posey Boyer, ACNP [facility] 07/10/2020, 8:24 AM

## 2020-07-10 NOTE — Progress Notes (Signed)
Hypoglycemic Event  CBG: 69  Treatment: 25 mL Dextrose  Symptoms: None noted  Follow-up CBG: HEKB:[id number] CBG Result:115  Possible Reasons for Event: Pt NPO  Comments/MD notified: Yes    Kyle Payne

## 2020-07-10 NOTE — Procedures (Signed)
Extubation Procedure Note  Patient Details:   Name: MARCELLAS MARCHANT DOB: 1941/03/31 MRN: [id number]   Airway Documentation:    Vent end date: 07/10/20 Vent end time: 0910   Evaluation  O2 sats: stable throughout Complications: No apparent complications Patient did tolerate procedure well. Bilateral Breath Sounds: Diminished   Yes   Pt extubated to 4L [place] per MD order. Positive cuff leak noted prior to extubation. Large blood clot attached to bottom of et tube upon removal. CCM NP called to bedside and shown. Pt VS within normal limits. Pt has strong productive cough, able to speak, denies SOB, no increased WOB. RT will closely watch pt for further blood clots  Carolan Shiver 07/10/2020, 9:20 AM

## 2020-07-11 LAB — CBC
HCT: 41.2 % (ref 39.0–52.0)
Hemoglobin: 13.1 g/dL (ref 13.0–17.0)
MCH: 29.8 pg (ref 26.0–34.0)
MCHC: 31.8 g/dL (ref 30.0–36.0)
MCV: 93.8 fL (ref 80.0–100.0)
Platelets: 180 10*3/uL (ref 150–400)
RBC: 4.39 MIL/uL (ref 4.22–5.81)
RDW: 13.3 % (ref 11.5–15.5)
WBC: 8.1 10*3/uL (ref 4.0–10.5)
nRBC: 0 % (ref 0.0–0.2)

## 2020-07-11 LAB — BASIC METABOLIC PANEL
Anion gap: 9 (ref 5–15)
BUN: 15 mg/dL (ref 8–23)
CO2: 25 mmol/L (ref 22–32)
Calcium: 8.8 mg/dL — ABNORMAL LOW (ref 8.9–10.3)
Chloride: 105 mmol/L (ref 98–111)
Creatinine, Ser: 0.69 mg/dL (ref 0.61–1.24)
GFR, Estimated: >60 mL/min (ref >60)
Glucose, Bld: 106 mg/dL — ABNORMAL HIGH (ref 70–99)
Potassium: 3.7 mmol/L (ref 3.5–5.1)
Sodium: 139 mmol/L (ref 135–145)

## 2020-07-11 LAB — LIPID PANEL
Cholesterol: 182 mg/dL (ref 0–200)
HDL: 38 mg/dL — ABNORMAL LOW (ref >40)
LDL Cholesterol: 133 mg/dL — ABNORMAL HIGH (ref 0–99)
Total CHOL/HDL Ratio: 4.8 RATIO
Triglycerides: 53 mg/dL (ref <150)
VLDL: 11 mg/dL (ref 0–40)

## 2020-07-11 LAB — HEMOGLOBIN A1C
Hgb A1c MFr Bld: 5.8 % — ABNORMAL HIGH (ref 4.8–5.6)
Mean Plasma Glucose: 119.76 mg/dL

## 2020-07-11 LAB — TRIGLYCERIDES: Triglycerides: 54 mg/dL (ref <150)

## 2020-07-11 MED ORDER — MELATONIN 3 MG PO TABS
3.0000 mg | ORAL_TABLET | Freq: Every evening | ORAL | Status: DC | PRN
Start: 2020-07-11 — End: 2020-07-13
  Administered 2020-07-11: 02:00:00 3 mg via ORAL
  Filled 2020-07-11: qty 1

## 2020-07-11 MED ORDER — IPRATROPIUM-ALBUTEROL 0.5-2.5 (3) MG/3ML IN SOLN: 3.0000 mL | Freq: Four times a day (QID) | RESPIRATORY_TRACT | Status: DC | PRN

## 2020-07-11 MED ORDER — GABAPENTIN 800 MG PO TABS
400.0000 mg | ORAL_TABLET | Freq: Four times a day (QID) | ORAL | Status: DC
  Filled 2020-07-11: qty 0.5

## 2020-07-11 MED ORDER — GABAPENTIN 300 MG PO CAPS
400.0000 mg | ORAL_CAPSULE | Freq: Four times a day (QID) | ORAL | Status: DC
  Administered 2020-07-11 – 2020-07-13 (×6): 400 mg via ORAL
  Filled 2020-07-11 (×7): qty 1

## 2020-07-11 MED ORDER — AMITRIPTYLINE HCL 25 MG PO TABS
50.0000 mg | ORAL_TABLET | Freq: Every day | ORAL | Status: DC
  Administered 2020-07-11 – 2020-07-12 (×2): 50 mg via ORAL
  Filled 2020-07-11 (×2): qty 2

## 2020-07-11 MED ORDER — IPRATROPIUM-ALBUTEROL 0.5-2.5 (3) MG/3ML IN SOLN
3.0000 mL | Freq: Two times a day (BID) | RESPIRATORY_TRACT | Status: DC
  Administered 2020-07-11 (×2): 3 mL via RESPIRATORY_TRACT
  Filled 2020-07-11 (×2): qty 3

## 2020-07-11 NOTE — Progress Notes (Signed)
STROKE TEAM PROGRESS NOTE   HISTORY OF PRESENT ILLNESS (per record) Kyle Payne is a 79 y.o. male with a history of COPD presenting with acute onset of respiratory distress in conjunction with dysarthria and left sided weakness. Per EMS, his sats dropped to the low 80's on arrival but were brought up to low 90's on NRB. BP 160/90, HR 80 and CBG 167 in the field. Per EMS, he was consistently weak on the left, with LUE quickly dropping to his side after he lifted it and with weak LLE when EMS tried to walk him. He was also dysarthric. No seizure like activity was reported by wife. On arrival the patient was somnolent with sonorous respirations that became stridorous when laid supine. Decision was made to intubate.  Prior to intubation, the patient's neurological exam revealed diffuse limb weakness and fatigueability but no asymmetry in strength or responses to sensory stimuli.  He takes ASA at home. He is not on an anticoagulant.  On limited ROS he denies headache, CP and N/V.  LSN: [id number] tPA Given: No: No lateralizing deficits on exam.  NIHSS: 12    INTERVAL HISTORY  Wife is at bedside, MRI negative, do not think this was a TIA, stroke signing off, answered any questions wife/patient had.   OBJECTIVE Vitals:   07/11/20 1000 07/11/20 1110 07/11/20 1140 07/11/20 1200  BP: (!) 135/97   (!) 132/91  Pulse: (!) 101   85  Resp: 19   12  Temp:   98.2 F (36.8 C)   TempSrc:   Oral   SpO2: 93% 94%  94%  Weight:      Height:        CBC:  Recent Labs  Lab 07/09/20 1925 07/09/20 1928 07/10/20 0119 07/10/20 0527 07/11/20 0322  WBC 5.8  --  7.0  --  8.1  NEUTROABS 3.2  --   --   --   --   HGB 14.2   < > 13.0 14.3 13.1  HCT 45.3   < > 42.0 42.0 41.2  MCV 94.8  --  96.1  --  93.8  PLT 193  --  161  --  180   < > = values in this interval not displayed.    Basic Metabolic Panel:  Recent Labs  Lab 07/10/20 0119 07/10/20 0527 07/11/20 0322  NA 138 139 139  K 4.5 3.2* 3.7  CL 105   --  105  CO2 23  --  25  GLUCOSE 91  --  106*  BUN 17  --  15  CREATININE 1.00  --  0.69  CALCIUM 8.5*  --  8.8*    Lipid Panel:     Component Value Date/Time   CHOL 182 07/11/2020 0322   CHOL 252 (H) 01/07/2018 1040   TRIG 54 07/11/2020 0322   TRIG 53 07/11/2020 0322   HDL 38 (L) 07/11/2020 0322   HDL 45 01/07/2018 1040   CHOLHDL 4.8 07/11/2020 0322   VLDL 11 07/11/2020 0322   LDLCALC 133 (H) 07/11/2020 0322   LDLCALC 180 (H) 01/07/2018 1040   HgbA1c:  Lab Results  Component Value Date   HGBA1C 5.8 (H) 07/11/2020   Urine Drug Screen:     Component Value Date/Time   LABOPIA NONE DETECTED 07/09/2020 2146   COCAINSCRNUR NONE DETECTED 07/09/2020 2146   LABBENZ NONE DETECTED 07/09/2020 2146   AMPHETMU NONE DETECTED 07/09/2020 2146   THCU NONE DETECTED 07/09/2020 2146   LABBARB NONE DETECTED 07/09/2020  2146    Alcohol Level     Component Value Date/Time   ETH <10 07/09/2020 1925    IMAGING  CT ANGIO HEAD CODE STROKE CT ANGIO NECK CODE STROKE CT CEREBRAL PERFUSION W CONTRAST 07/09/2020 IMPRESSION:  1. Normal CT angiography of the head and neck vessels.  2. Negative perfusion study.  3. Emphysema and aortic atherosclerosis.  Aortic Atherosclerosis (ICD10-I70.0) and Emphysema (ICD10-J43.9).   MRI BRAIN WO CONTRAST 07/10/2020 IMPRESSION: No acute or reversible finding. No specific cause of the presenting symptoms is identified. Mild chronic small-vessel ischemic changes throughout the brain as outlined above. 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  CT HEAD CODE STROKE WO CONTRAST 07/09/2020 IMPRESSION:  1. Some sort of artifact in the posterior fossa. Cerebellar and lower occipital infarction can not be excluded. Otherwise, no acute finding.  2. ASPECTS is 10.   DG Chest Portable 1 View 07/09/2020 IMPRESSION:  1. Endotracheal tube positioning, as described above.  2. Mild bibasilar atelectasis and/or infiltrate, right greater than left.  DG Abd  Portable 1V 07/09/2020 IMPRESSION:  1. Endotracheal tube tip about 4.2 cm superior to the carina.  2. Esophageal side port projects over distal esophagus, recommend further advancement by at least 10 cm for more optimal positioning.  3. Small bilateral pleural effusions and bibasilar airspace disease  Transthoracic Echocardiogram  07/10/2020 IMPRESSIONS  1. Left ventricular ejection fraction, by estimation, is 60 to 65%. The  left ventricle has normal function. The left ventricle has no regional  wall motion abnormalities. There is mild concentric left ventricular  hypertrophy. Left ventricular diastolic  parameters are consistent with Grade I diastolic dysfunction (impaired  relaxation).  2. Right ventricular systolic function is normal. The right ventricular  size is moderately enlarged.  3. The mitral valve is normal in structure. Mild mitral valve  regurgitation.  4. The aortic valve is tricuspid. Aortic valve regurgitation is not  visualized.  Conclusion(s)/Recommendation(s): No intracardiac source of embolism  detected on this transthoracic study. A transesophageal echocardiogram is  recommended to exclude cardiac source of embolism if clinically indicated.   ECG - SB rate 47 BPM. (See cardiology reading for complete details)  PHYSICAL EXAM Blood pressure (!) 132/91, pulse 85, temperature 98.2 F (36.8 C), temperature source Oral, resp. rate 12, height (S) 6' (1.829 m), weight 87 kg, SpO2 94 %.  Patient is laying in bed, intubated, no acute distress, he is awake and alert, following commands bilaterally, nods appropriately to questions, pupils equally round and reactive to light, blinks bilaterally to threat visual fields intact, difficult to assess facial symmetry due to ET tube, patient is restrained but no focal deficits apparent, he is able to raise both arms and both legs without drift, no asymmetry noted, grip intact, withdraws in all extremities, reflexes symmetric,  toes equivocal, not able to test gait or coordination.  ASSESSMENT/PLAN Kyle Payne is a 79 y.o. male with history of history of COPD presenting with acute onset of respiratory distress in conjunction with dysarthria, left sided weakness and somnolence. Prior to intubation in ED neurological exam revealed diffuse limb weakness and fatigueability but no asymmetry in strength or responses to sensory stimuli. He did not receive IV t-PA due to no lateralizing deficits on exam.    MRI negative, no focal neuro deficits appreciated, unlikely TIA  Code Stroke CT Head - Some sort of artifact in the posterior fossa. Cerebellar and lower occipital infarction can not be excluded. Otherwise, no acute finding. ASPECTS is 10.   CT  head - not ordered  MRI head  - no acute findings. Old small vessel infarction  left thalamus.  MRA head - not ordered  CTA H&N - Normal CT angiography of the head and neck vessels.   CT Perfusion - normal  Carotid Doppler - CTA neck ordered - carotid dopplers not indicated.  2D Echo - EF 60 - 65%. No cardiac source of emboli identified.   Sars Corona Virus 2 - negative  LDL - 133  HgbA1c - 5.8  UDS - negative  VTE prophylaxis - Lovenox Diet  Diet Order            Diet regular Room service appropriate? Yes; Fluid consistency: Thin  Diet effective now                 aspirin 325 mg daily prior to admission, now on No antithrombotic Per Dr Shelbie Hutching note - continue ASA 325 mg daily (pt has gastric tube)  Patient will be counseled to be compliant with his antithrombotic medications  Ongoing aggressive stroke risk factor management  Therapy recommendations:  HHOT  Disposition:  Pending  Hypertension  Home BP meds: none   Current BP meds: Levophed  BP mildly low at times . Permissive hypertension (OK if < 220/120) but gradually normalize in 5-7 days  . Long-term BP goal normotensive  Hyperlipidemia  Home Lipid lowering medication: none    LDL pending, goal < 70  Current lipid lowering medication: none / NPO   Continue statin at discharge  Other Stroke Risk Factors  Advanced age  Former cigarette smoker - quit  Family hx stroke (father)   Previous infarct by imaging  Other Active Problems, Findings and Recommendations  Code status - Full code  Aortic Atherosclerosis (ICD10-I70.0)  Emphysema (ICD10-J43.9).   Empiric antibiotics for severe CAP - Cefopime & Zithromax ; (Vancomycin - D/C'd)  Hypokalemia - potassium 3.2 ->3.7  Bradycardia - resolved  Hospital day # 2  Patient without any focal neuro deficits.  MRI negative. Unlikely TIA.  Stroke signing off.   Personally examined patient and images, and have participated in and made any corrections needed to history, physical, neuro exam,assessment and plan as stated above.  I have personally obtained the history, evaluated lab date, reviewed imaging studies and agree with radiology interpretations.    Naomie Dean, MD Stroke Neurology  I spent 15 minutes of face-to-face and non-face-to-face time with patient. This included prechart review, lab review, study review, order entry, electronic health record documentation, patient education on the different diagnostic and therapeutic options, counseling and coordination of care, risks and benefits of management, compliance, or risk factor reduction   To contact Stroke Continuity provider, please refer to [email or web address]. After hours, contact General Neurology

## 2020-07-11 NOTE — Evaluation (Addendum)
Occupational Therapy Evaluation Patient Details Name: Kyle Payne MRN: [id number] DOB: 1941-01-11 Today's Date: 07/11/2020    History of Present Illness Pt is a 79 y/o male who presents with sudden onset LE weakness - first L LE and then R LE. He had left facial droop and mildly slurred speech, which per his sister slurred speech is his baseline. He had weakness in all extremities upon presentation and had progressive encephalopathy. He was intubated for airway protection 12/17, extubated 12/18. Brain MRI negative for acute changes. CXR 12/18 concerning for BLL infitrates. PMH significant for COPD, rotator cuff repair, inguinal hernia repair.   Clinical Impression   This 79 y/o male presents with the above. PTA pt reports being independent with ADL and functional mobility. Pt seated in recliner upon arrival pleasant and willing to participate in therapy session. Today pt performing room/hallway level mobility tasks without AD and overall minA (+2 present for equipement/safety). Pt requiring up to [place] for LB ADL. Pt on 4L O2 throughout session with SpO2 >/=90% throughout. He reports lives with spouse who can assist some PRN and also has family who live next door and can assist as well. Pt to benefit from continued acute OT services and currently recommend follow up [facility] services after discharge to maximize his overall safety and independence with ADL and mobility.     Follow Up Recommendations  Home health OT;Supervision/Assistance - 24 hour    Equipment Recommendations  None recommended by OT (pt's DME needs are met)           Precautions / Restrictions Precautions Precautions: Fall Precaution Comments: watch O2 Restrictions Weight Bearing Restrictions: No      Mobility Bed Mobility               General bed mobility comments: Pt was received sitting up in the recliner. RN reports pt with desaturation during transfer to the chair.    Transfers Overall transfer level:  Needs assistance Equipment used: None Transfers: Sit to/from Stand Sit to Stand: Min guard;Supervision         General transfer comment: Therapist managed lines, but pt was able to power-up to full stand with hands-on guarding but without assistance. At end of session pt stand>sit with supervision for safety.    Balance Overall balance assessment: Needs assistance Sitting-balance support: Feet supported;No upper extremity supported Sitting balance-Leahy Scale: Good     Standing balance support: No upper extremity supported;During functional activity Standing balance-Leahy Scale: Fair Standing balance comment: No UE support required however appeared guarded.                           ADL either performed or assessed with clinical judgement   ADL Overall ADL's : Needs assistance/impaired Eating/Feeding: Modified independent;Sitting   Grooming: Minimal assistance;Standing   Upper Body Bathing: Min guard;Sitting   Lower Body Bathing: Minimal assistance;Sit to/from stand   Upper Body Dressing : Min guard;Set up;Sitting   Lower Body Dressing: Sit to/from stand Lower Body Dressing Details (indicate cue type and reason): assist to don slippers today Toilet Transfer: Minimal assistance;+2 for safety/equipment;Ambulation Toilet Transfer Details (indicate cue type and reason): simulated via transfer to/from recliner, room/hallway level mobility Toileting- Clothing Manipulation and Hygiene: Minimal assistance;Sit to/from stand       Functional mobility during ADLs: Minimal assistance;+2 for safety/equipment  Pertinent Vitals/Pain Pain Assessment: No/denies pain     Hand Dominance     Extremity/Trunk Assessment Upper Extremity Assessment Upper Extremity Assessment: Overall WFL for tasks assessed   Lower Extremity Assessment Lower Extremity Assessment: Defer to PT evaluation   Cervical / Trunk Assessment Cervical / Trunk  Assessment: Other exceptions Cervical / Trunk Exceptions: Forward head posture and rounded shoulders   Communication Communication Communication: Other (comment) (slurred speech baseline per chart review)   Cognition Arousal/Alertness: Awake/alert Behavior During Therapy: WFL for tasks assessed/performed Overall Cognitive Status: Within Functional Limits for tasks assessed                                     General Comments  pt's sister present and supportive during session    Exercises     Shoulder Instructions      Home Living Family/patient expects to be discharged to:: Private residence Living Arrangements: Spouse/significant other Available Help at Discharge: Family;Available 24 hours/day Type of Home: House Home Access: Stairs to enter Entrance Stairs-Number of Steps: 2 Entrance Stairs-Rails: Right;Left Home Layout: One level     Bathroom Shower/Tub: Tub/shower unit;Walk-in shower (walk-in shower outside in "out building")   Bathroom Toilet: Handicapped height Bathroom Accessibility: Yes   Home Equipment: Walker - 4 wheels;Walker - 2 wheels;Shower seat;Hand held shower head   Additional Comments: Lives with wife, however has 2 sisters that live very close (at least 1 is next door). Wife with PMH of CVA and breast CA, and pt reports she has difficulty walking. However, he does not have to assist her with anything at home, and sisters will be available if pt needs physical assist.      Prior Functioning/Environment Level of Independence: Independent        Comments: Drives, gardens and works in the yard a lot.        OT Problem List: Decreased strength;Decreased range of motion;Decreased activity tolerance;Impaired balance (sitting and/or standing);Cardiopulmonary status limiting activity      OT Treatment/Interventions: Self-care/ADL training;Therapeutic exercise;DME and/or AE instruction;Energy conservation;Therapeutic activities;Cognitive  remediation/compensation;Patient/family education;Balance training    OT Goals(Current goals can be found in the care plan section) Acute Rehab OT Goals Patient Stated Goal: Return home ASAP OT Goal Formulation: With patient Time For Goal Achievement: 07/25/20 Potential to Achieve Goals: Good  OT Frequency: Min 2X/week   Barriers to D/C:            Co-evaluation PT/OT/SLP Co-Evaluation/Treatment: Yes Reason for Co-Treatment: To address functional/ADL transfers   OT goals addressed during session: ADL's and self-care      AM-PAC OT "6 Clicks" Daily Activity     Outcome Measure Help from another person eating meals?: None Help from another person taking care of personal grooming?: A Little Help from another person toileting, which includes using toliet, bedpan, or urinal?: A Little Help from another person bathing (including washing, rinsing, drying)?: A Little Help from another person to put on and taking off regular upper body clothing?: A Little Help from another person to put on and taking off regular lower body clothing?: A Lot 6 Click Score: 18   End of Session Equipment Utilized During Treatment: Gait belt;Oxygen (4L) Nurse Communication: Mobility status  Activity Tolerance: Patient tolerated treatment well Patient left: in chair;with call bell/phone within reach;with family/visitor present  OT Visit Diagnosis: Muscle weakness (generalized) (M62.81);Unsteadiness on feet (R26.81);Other symptoms and signs involving the nervous system (R29.898)                  Time: 0811-0835 OT Time Calculation (min): 24 min Charges:  OT General Charges $OT Visit: 1 Visit OT Evaluation $OT Eval Moderate Complexity: 1 Mod  Breanna Campbell, OT Acute Rehabilitation Services Pager [phone] Office [phone]   Breanna L Campbell 07/11/2020, 2:42 PM 

## 2020-07-11 NOTE — Progress Notes (Signed)
eLink Physician-Brief Progress Note Patient Name: ZHION PEVEHOUSE DOB: 14-Aug-1940 MRN: [id number]   Date of Service  07/11/2020  HPI/Events of Note  Patient requests a sleep aid.   eICU Interventions  Plan: 1. Melatonin 3 mg PO Q HS PRN sleep.      Intervention Category Major Interventions: Other:  Joelee Snoke Zahradnik 07/11/2020, 1:24 AM

## 2020-07-11 NOTE — Progress Notes (Signed)
  Speech Language Pathology Treatment: Dysphagia  Patient Details Name: Kyle Payne MRN: [id number] DOB: [birth year] Today's Date: 07/11/2020 Time: 4388-8757 SLP Time Calculation (min) (ACUTE ONLY): 9 min  Assessment / Plan / Recommendation Clinical Impression  Seen after yesterday's bedside swallow evaluation with sister present. Pt denied difficulty with breakfast or prior episodes coughing. Agree with evaluating therapist that globus sensation he reports is likely due to his GER. He reports a stronger vocal quality today and there were no s/s aspiration with solid or thin with multiple sips. Educated/reviewed GER strategies and if his globus sensation increased, difficulty transiting or further bouts of pna would recommend MBS at that time however do not feel it is presently needed; pt in agreement.    HPI HPI: Kyle Payne is a 79 y/o gentleman who was brought to the ED after sudden onset leg weakness, first in his left leg then his right. History obtained from sister at bedside in the ED, EDP, and chart review. He had left facial droop and mildly slurred speech, which per his sister slurred speech is his baseline. He had weakness in all extremities upon presentation and had progressive encephalopathy. developing somnolence and snoring with likely obstructive apneas when he was laid flat in the ED. He was intubated for airway protection. CT perfusion study did not demonstrate a CVA. CXR 12/18 concerning for BLL infitrates. Pt has hx of esophageal dysphagia, s/p dilation, takes omeprazole.      SLP Plan  All goals met;Discharge SLP treatment due to (comment)       Recommendations  Diet recommendations: Regular;Thin liquid Liquids provided via: Cup;Straw Medication Administration: Whole meds with liquid Supervision: Patient able to self feed Postural Changes and/or Swallow Maneuvers: Seated upright 90 degrees;Upright 30-60 min after meal                Oral Care Recommendations: Oral  care BID Follow up Recommendations: None SLP Visit Diagnosis: Dysphagia, unspecified (R13.10) Plan: All goals met;Discharge SLP treatment due to (comment)       GO                Kyle Payne 07/11/2020, 9:41 AM  Kyle Payne.Ed Risk analyst [phone] Office [phone]

## 2020-07-11 NOTE — Progress Notes (Signed)
PROGRESS NOTE    Kyle Payne  JQG:[id number] DOB: [birth year] DOA: 07/09/2020 PCP: Algis Greenhouse, MD   Brief Narrative: 79 year old with past medical history significant for obesity, OSA not on CPAP, COPD, hyperlipidemia, who presented with sudden onset of leg weakness, for his left leg and then his right leg.  History was obtained from sister who was at bedside.  Patient had left facial droop and mildly slurred speech which per sister slurred speech is his baseline.  He had weakness in whole extremity upon presentation and progressive encephalopathy.  Patient developed somnolence and snoring with likely obstructive apnea when he laid flat in the ED.  Patient was intubated for airway protection.  CT head was negative for CVA.  He had transient hypotension due to sedation responsive to short-term pressors and IV fluids.  Patient has not been sick recently.  No new medication.  Intubated on admission 12/17.  Neurology was consulted.  Patient was able to be extubated on 12/18.  Transfer to hospitalist care 12/19.    Assessment & Plan:   Active Problems:   Acute respiratory failure with hypoxia and hypercapnia (HCC)   Altered mental status   1-Acute Respiratory Failure with Hypoxia and Hypercapnia: secondary to PNA Ph 7.2, PCO2; 60 Precipitated by mental status changes or bibasilar pneumonia versus aspiration. Continue with nebulizer. Continue with cefepime and azithromycin for pneumonia for 5 days. Urine Legionella antigen: Pending Urinary pneumococcal antigen negative Tracheal culture; growing gram-positive cocci and rare gram-negative rods.  2-Acute Metabolic encephalopathy: secondary to hypercapnia versus sepsis acute diffuse weakness: Neurology following. CT head; artifact in the posterior fossa.  Cerebellar and lower occipital infarction cannot be excluded.  No acute finding. CTA head and neck: Normal.  Negative perfusion study. MRI Brain: No acute or reversible finding.  Mild  chronic small vessel ischemic changes.  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  LDL 133, HbA1c 5.8, ECHO normal EF, no thrombus, .  Improved.  3-Sepsis; PNA;  Chest x ray ; Bilateral infiltrates.  Patient presents with encephalopathy, hypothermia T 94, Tachypnea RR 25, subsequently tachycardic.  Tracheal culture; growing gram-positive cocci and rare gram-negative rods. Continue with cefepime and azithromycin. Need modified swallow eval  Hypoglycemia: Resolved  4-elevated TSH:5.8 Plan to check free T3-T4 in the morning  OSA not on CPAP, COPD: Continue with nebulizer twice daily.  Hypotension: He was transiently on Levophed.  This was thought to be related to sedation.  Resolved  Neuropathy: Resume lower home dose gabapentin, will also resume amitriptyline.   Estimated body mass index is 26.01 kg/m as calculated from the following:   Height as of this encounter: 6' (1.829 m).   Weight as of this encounter: 87 kg.   DVT prophylaxis: Lovenox Code Status: Full code Family Communication: Care discussed with patient and sister who was at bedside Disposition Plan:  Status is: Inpatient  Remains inpatient appropriate because:IV treatments appropriate due to intensity of illness or inability to take PO   Dispo: The patient is from: Home              Anticipated d/c is to: Awaiting PT OT recommendation              Anticipated d/c date is: 2 days              Patient currently is not medically stable to d/c.        Consultants:   Neurology  CCM admitted patient    Procedures:   Endotracheal intubation 12/17  Antimicrobials:  Cefepime and azithromycin 12/17  Subjective: Patient is alert and conversant, he denies worsening shortness of breath.  He is not breathing at his baseline yet.  Denies worsening cough.  He had a bowel movement today.  Objective: Vitals:   07/11/20 0300 07/11/20 0353 07/11/20 0500 07/11/20 0600  BP: 127/70  (!) 130/91 124/81   Pulse: 97  93 85  Resp: 17  (!) 22   Temp:  97.7 F (36.5 C)    TempSrc:  Oral    SpO2: 95%  93% 93%  Weight:   87 kg   Height:        Intake/Output Summary (Last 24 hours) at 07/11/2020 0710 Last data filed at 07/11/2020 0400 Gross per 24 hour  Intake 676.34 ml  Output 1800 ml  Net -1123.66 ml   Filed Weights   07/10/20 0039 07/10/20 0405 07/11/20 0500  Weight: 86.8 kg 86.8 kg 87 kg    Examination:  General exam: Appears calm and comfortable  Respiratory system: Bilateral rhonchorous. Respiratory effort normal. Cardiovascular system: S1 & S2 heard, RRR. No JVD, murmurs, rubs, gallops or clicks. No pedal edema. Gastrointestinal system: Abdomen is nondistended, soft and nontender. No organomegaly or masses felt. Normal bowel sounds heard.   Data Reviewed: I have personally reviewed following labs and imaging studies  CBC: Recent Labs  Lab 07/09/20 1925 07/09/20 1928 07/09/20 2145 07/10/20 0119 07/10/20 0527 07/11/20 0322  WBC 5.8  --   --  7.0  --  8.1  NEUTROABS 3.2  --   --   --   --   --   HGB 14.2 15.6 13.6 13.0 14.3 13.1  HCT 45.3 46.0 40.0 42.0 42.0 41.2  MCV 94.8  --   --  96.1  --  93.8  PLT 193  --   --  161  --  378   Basic Metabolic Panel: Recent Labs  Lab 07/09/20 1925 07/09/20 1928 07/09/20 2145 07/10/20 0119 07/10/20 0527 07/11/20 0322  NA 138 138 137 138 139 139  K 3.8 3.9 4.0 4.5 3.2* 3.7  CL 99 100  --  105  --  105  CO2 27  --   --  23  --  25  GLUCOSE 97 94  --  91  --  106*  BUN 19 23  --  17  --  15  CREATININE 1.09 1.00  --  1.00  --  0.69  CALCIUM 9.6  --   --  8.5*  --  8.8*   GFR: Estimated Creatinine Clearance: 82.2 mL/min (by C-G formula based on SCr of 0.69 mg/dL). Liver Function Tests: Recent Labs  Lab 07/09/20 1925  AST 22  ALT 21  ALKPHOS 49  BILITOT 0.6  PROT 7.1  ALBUMIN 4.1   No results for input(s): LIPASE, AMYLASE in the last 168 hours. No results for input(s): AMMONIA in the last 168  hours. Coagulation Profile: Recent Labs  Lab 07/09/20 1925  INR 1.0   Cardiac Enzymes: No results for input(s): CKTOTAL, CKMB, CKMBINDEX, TROPONINI in the last 168 hours. BNP (last 3 results) No results for input(s): PROBNP in the last 8760 hours. HbA1C: Recent Labs    07/11/20 0322  HGBA1C 5.8*   CBG: Recent Labs  Lab 07/09/20 1943 07/10/20 0152 07/10/20 0326 07/10/20 0732 07/10/20 0858  GLUCAP 93 79 84 69* 115*   Lipid Profile: Recent Labs    07/10/20 0119 07/11/20 0322  CHOL  --  182  HDL  --  28*  LDLCALC  --  133*  TRIG 86 53  54  CHOLHDL  --  4.8   Thyroid Function Tests: Recent Labs    07/09/20 2335  TSH 5.855*   Anemia Panel: No results for input(s): VITAMINB12, FOLATE, FERRITIN, TIBC, IRON, RETICCTPCT in the last 72 hours. Sepsis Labs: Recent Labs  Lab 07/09/20 2335 07/10/20 0119  LATICACIDVEN 1.4 1.9    Recent Results (from the past 240 hour(s))  Resp Panel by RT-PCR (Flu A&B, Covid) Nasopharyngeal Swab     Status: None   Collection Time: 07/09/20  7:34 PM   Specimen: Nasopharyngeal Swab; Nasopharyngeal(NP) swabs in vial transport medium  Result Value Ref Range Status   SARS Coronavirus 2 by RT PCR NEGATIVE NEGATIVE Final    Comment: (NOTE) SARS-CoV-2 target nucleic acids are NOT DETECTED.  The SARS-CoV-2 RNA is generally detectable in upper respiratory specimens during the acute phase of infection. The lowest concentration of SARS-CoV-2 viral copies this assay can detect is 138 copies/mL. A negative result does not preclude SARS-Cov-2 infection and should not be used as the sole basis for treatment or other patient management decisions. A negative result may occur with  improper specimen collection/handling, submission of specimen other than nasopharyngeal swab, presence of viral mutation(s) within the areas targeted by this assay, and inadequate number of viral copies(<138 copies/mL). A negative result must be combined  with clinical observations, patient history, and epidemiological information. The expected result is Negative.  Fact Sheet for Patients:  EntrepreneurPulse.com.au  Fact Sheet for Healthcare Providers:  IncredibleEmployment.be  This test is no t yet approved or cleared by the [place] FDA and  has been authorized for detection and/or diagnosis of SARS-CoV-2 by FDA under an Emergency Use Authorization (EUA). This EUA will remain  in effect (meaning this test can be used) for the duration of the COVID-19 declaration under Section 564(b)(1) of the Act, 21 U.S.C.section 360bbb-3(b)(1), unless the authorization is terminated  or revoked sooner.       Influenza A by PCR NEGATIVE NEGATIVE Final   Influenza B by PCR NEGATIVE NEGATIVE Final    Comment: (NOTE) The Xpert Xpress SARS-CoV-2/FLU/RSV plus assay is intended as an aid in the diagnosis of influenza from Nasopharyngeal swab specimens and should not be used as a sole basis for treatment. Nasal washings and aspirates are unacceptable for Xpert Xpress SARS-CoV-2/FLU/RSV testing.  Fact Sheet for Patients: EntrepreneurPulse.com.au  Fact Sheet for Healthcare Providers: IncredibleEmployment.be  This test is not yet approved or cleared by the [place] FDA and has been authorized for detection and/or diagnosis of SARS-CoV-2 by FDA under an Emergency Use Authorization (EUA). This EUA will remain in effect (meaning this test can be used) for the duration of the COVID-19 declaration under Section 564(b)(1) of the Act, 21 U.S.C. section 360bbb-3(b)(1), unless the authorization is terminated or revoked.  Performed at [facility], [place] [address], [place], [place] [address]   Culture, blood (routine x 2)     Status: None (Preliminary result)   Collection Time: 07/09/20 11:35 PM   Specimen: BLOOD RIGHT HAND  Result Value Ref Range Status   Specimen  Description BLOOD RIGHT HAND  Final   Special Requests   Final    BOTTLES DRAWN AEROBIC AND ANAEROBIC Blood Culture results may not be optimal due to an inadequate volume of blood received in culture bottles   Culture   Final    NO GROWTH < 12 HOURS Performed at [facility], [address] [place], [place] [address]    Report Status PENDING  Incomplete  Culture, blood (routine x 2)     Status: None (Preliminary result)   Collection Time: 07/09/20 11:35 PM   Specimen: BLOOD LEFT HAND  Result Value Ref Range Status   Specimen Description BLOOD LEFT HAND  Final   Special Requests   Final    BOTTLES DRAWN AEROBIC AND ANAEROBIC Blood Culture results may not be optimal due to an inadequate volume of blood received in culture bottles   Culture   Final    NO GROWTH < 12 HOURS Performed at [facility], [place] [address], [place], [place] [address]    Report Status PENDING  Incomplete  Culture, respiratory (non-expectorated)     Status: None (Preliminary result)   Collection Time: 07/09/20 11:35 PM   Specimen: Tracheal Aspirate; Respiratory  Result Value Ref Range Status   Specimen Description TRACHEAL ASPIRATE  Final   Special Requests NONE  Final   Gram Stain   Final    RARE WBC PRESENT, PREDOMINANTLY PMN ABUNDANT GRAM POSITIVE COCCI RARE GRAM NEGATIVE RODS Performed at [facility], [place] [address], [place], [place] [address]    Culture PENDING  Incomplete   Report Status PENDING  Incomplete  MRSA PCR Screening     Status: None   Collection Time: 07/10/20 12:40 AM   Specimen: Nasopharyngeal  Result Value Ref Range Status   MRSA by PCR NEGATIVE NEGATIVE Final    Comment:        The GeneXpert MRSA Assay (FDA approved for NASAL specimens only), is one component of a comprehensive MRSA colonization surveillance program. It is not intended to diagnose MRSA infection nor to guide or monitor treatment for MRSA infections. Performed at [facility], [place] [address], [place], [place] [address]          Radiology Studies: MR BRAIN WO CONTRAST  Result Date: 07/10/2020 CLINICAL DATA:  Sudden onset of left leg more than right leg weakness. EXAM: MRI HEAD WITHOUT CONTRAST TECHNIQUE: Multiplanar, multiecho pulse sequences of the brain and surrounding structures were obtained without intravenous contrast. COMPARISON:  CT studies done yesterday. FINDINGS: Brain: Diffusion imaging does not show any acute or subacute infarction. The brainstem and cerebellum are normal. Old small vessel infarction in the left thalamus. Mild chronic small-vessel ischemic changes affect the cerebral hemispheric white matter. No cortical or large vessel territory infarction. No mass lesion, hemorrhage, hydrocephalus or extra-axial collection. Vascular: Major vessels at the base of the brain show flow. Skull and upper cervical spine: Negative Sinuses/Orbits: Clear/normal Other: None IMPRESSION: No acute or reversible finding. No specific cause of the presenting symptoms is identified. Mild chronic small-vessel ischemic changes throughout the brain as outlined above. 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 Electronically Signed   By: Nelson Chimes M.D.   On: 07/10/2020 15:27   CT CEREBRAL PERFUSION W CONTRAST  Result Date: 07/09/2020 CLINICAL DATA:  Left-sided weakness EXAM: CT ANGIOGRAPHY HEAD AND NECK CT PERFUSION BRAIN TECHNIQUE: Multidetector CT imaging of the head and neck was performed using the standard protocol during bolus administration of intravenous contrast. Multiplanar CT image reconstructions and MIPs were obtained to evaluate the vascular anatomy. Carotid stenosis measurements (when applicable) are obtained utilizing NASCET criteria, using the distal internal carotid diameter as the denominator. Multiphase CT imaging of the brain was performed following IV bolus contrast injection. Subsequent parametric perfusion maps were calculated using RAPID software. CONTRAST:   139m OMNIPAQUE  IOHEXOL 350 MG/ML SOLN COMPARISON:  Head CT same day FINDINGS: CTA NECK FINDINGS Aortic arch: Aortic atherosclerosis. Branching pattern is normal without origin stenosis. Right carotid system: Normal common carotid artery and bifurcation. Cervical ICA is tortuous but widely patent. Left carotid system: Normal Vertebral arteries: Vertebral artery origins widely patent. Both vertebral arteries appear normal through the cervical region to the foramen magnum. Skeleton: Ordinary cervical spondylosis. Other neck: No mass or lymphadenopathy. Upper chest: Emphysema and pulmonary scarring. Review of the MIP images confirms the above findings CTA HEAD FINDINGS Anterior circulation: Both internal carotid arteries widely patent through the skull base and siphon regions. The anterior and middle cerebral vessels are normal. No large or medium vessel occlusion. Posterior circulation: Both vertebral arteries widely patent to the basilar. No basilar stenosis. Posterior circulation branch vessels appear normal. Venous sinuses: Patent and normal. Anatomic variants: None significant. Review of the MIP images confirms the above findings CT Brain Perfusion Findings: ASPECTS: 10 CBF (<30%) Volume: 81m Perfusion (Tmax>6.0s) volume: 028mMismatch Volume: 32m7mnfarction Location:None IMPRESSION: 1. Normal CT angiography of the head and neck vessels. 2. Negative perfusion study. 3. Emphysema and aortic atherosclerosis. Aortic Atherosclerosis (ICD10-I70.0) and Emphysema (ICD10-J43.9). Electronically Signed   By: MarNelson ChimesD.   On: 07/09/2020 20:40   DG Chest Port 1 View  Result Date: 07/10/2020 CLINICAL DATA:  Acute respiratory failure. Acute respiratory failure. Endotracheally intubated. Orogastric tube placement. EXAM: PORTABLE CHEST 1 VIEW COMPARISON:  07/09/2020 FINDINGS: Endotracheal tube remains in appropriate position. A new nasogastric tube is seen entering the stomach. Heart size remains within normal limits.  Bibasilar pulmonary infiltrates show no significant change. No new or worsening areas of pulmonary infiltrate are seen. Mild bibasilar pleural thickening noted, without definite effusion. IMPRESSION: New nasogastric tube in seen entering the stomach. No significant change in bibasilar pulmonary infiltrates. Electronically Signed   By: JohMarlaine HindD.   On: 07/10/2020 08:35   DG Chest Portable 1 View  Result Date: 07/09/2020 CLINICAL DATA:  Status post intubation. EXAM: PORTABLE CHEST 1 VIEW COMPARISON:  May 06, 2015 FINDINGS: An endotracheal tube is seen with its distal tip approximately 3.3 cm from the carina. The lungs are hyperinflated. Mild atelectasis and/or infiltrate is seen within the bilateral lung bases, right greater than left. There is no evidence of a pleural effusion or pneumothorax. The heart size and mediastinal contours are within normal limits. The visualized skeletal structures are unremarkable. IMPRESSION: 1. Endotracheal tube positioning, as described above. 2. Mild bibasilar atelectasis and/or infiltrate, right greater than left. Electronically Signed   By: ThaVirgina NorfolkD.   On: 07/09/2020 20:08   DG Abd Portable 1V  Result Date: 07/10/2020 CLINICAL DATA:  Evaluate OG tube EXAM: PORTABLE ABDOMEN - 1 VIEW COMPARISON:  None. FINDINGS: The OG tube terminates in the stomach. IMPRESSION: The OG tube terminates in the stomach. Electronically Signed   By: DavDorise BullionI M.D   On: 07/10/2020 09:50   DG Abd Portable 1V  Result Date: 07/09/2020 CLINICAL DATA:  OG tube placement EXAM: PORTABLE ABDOMEN - 1 VIEW COMPARISON:  07/09/2020 FINDINGS: Endotracheal tube tip is about 4.2 cm superior to the carina. Esophageal tube side-port projects over the distal esophagus. Small bilateral pleural effusions with left greater than right basilar airspace disease. Stable cardiomediastinal silhouette. No pneumothorax. Excreted contrast within the renal collecting systems. IMPRESSION:  1. Endotracheal tube tip about 4.2 cm superior to the carina. 2. Esophageal side port projects over distal esophagus, recommend further advancement by at  least 10 cm for more optimal positioning. 3. Small bilateral pleural effusions and bibasilar airspace disease. Electronically Signed   By: Donavan Foil M.D.   On: 07/09/2020 22:06   ECHOCARDIOGRAM COMPLETE  Result Date: 07/10/2020    ECHOCARDIOGRAM REPORT   Patient Name:   SHADEED COLBERG Date of Exam: 07/10/2020 Medical Rec #:  [id number]     Height:       72.0 in Accession #:    [id number]    Weight:       191.4 lb Date of Birth:  Mar 03, 1941     BSA:          2.091 m Patient Age:    34 years      BP:           112/84 mmHg Patient Gender: M             HR:           76 bpm. Exam Location:  Inpatient Procedure: 2D Echo Indications:    TIA 435.9 / G45.9  History:        Patient has no prior history of Echocardiogram examinations.                 Signs/Symptoms:Syncope; Risk Factors:Dyslipidemia and Former                 Smoker. Acute respiratory failure with hypoxia and hypercapnia.  Sonographer:    Leavy Cella Referring Phys: [id number]  1. Left ventricular ejection fraction, by estimation, is 60 to 65%. The left ventricle has normal function. The left ventricle has no regional wall motion abnormalities. There is mild concentric left ventricular hypertrophy. Left ventricular diastolic parameters are consistent with Grade I diastolic dysfunction (impaired relaxation).  2. Right ventricular systolic function is normal. The right ventricular size is moderately enlarged.  3. The mitral valve is normal in structure. Mild mitral valve regurgitation.  4. The aortic valve is tricuspid. Aortic valve regurgitation is not visualized. Conclusion(s)/Recommendation(s): No intracardiac source of embolism detected on this transthoracic study. A transesophageal echocardiogram is recommended to exclude cardiac source of embolism if clinically  indicated. FINDINGS  Left Ventricle: Left ventricular ejection fraction, by estimation, is 60 to 65%. The left ventricle has normal function. The left ventricle has no regional wall motion abnormalities. The left ventricular internal cavity size was normal in size. There is  mild concentric left ventricular hypertrophy. Left ventricular diastolic parameters are consistent with Grade I diastolic dysfunction (impaired relaxation). Right Ventricle: The right ventricular size is moderately enlarged. Right vetricular wall thickness was not well visualized. Right ventricular systolic function is normal. Left Atrium: Left atrial size was normal in size. Right Atrium: Right atrial size was normal in size. Pericardium: There is no evidence of pericardial effusion. Mitral Valve: The mitral valve is normal in structure. There is mild thickening of the mitral valve leaflet(s). Mild mitral annular calcification. Mild mitral valve regurgitation. Tricuspid Valve: The tricuspid valve is normal in structure. Tricuspid valve regurgitation is trivial. Aortic Valve: The aortic valve is tricuspid. Aortic valve regurgitation is not visualized. Pulmonic Valve: The pulmonic valve was normal in structure. Pulmonic valve regurgitation is not visualized. Aorta: The aortic root is normal in size and structure. Venous: IVC assessment for right atrial pressure unable to be performed due to mechanical ventilation. IAS/Shunts: No atrial level shunt detected by color flow Doppler.  LEFT VENTRICLE PLAX 2D LVIDd:         4.20 cm  Diastology LVIDs:         2.60 cm  LV e' medial:    6.85 cm/s LV PW:         0.90 cm  LV E/e' medial:  13.8 LV IVS:        1.20 cm  LV e' lateral:   9.46 cm/s LVOT diam:     2.10 cm  LV E/e' lateral: 10.0 LVOT Area:     3.46 cm  RIGHT VENTRICLE RV S prime:     13.40 cm/s TAPSE (M-mode): 2.6 cm LEFT ATRIUM             Index       RIGHT ATRIUM          Index LA diam:        3.50 cm 1.67 cm/m  RA Area:     9.51 cm LA Vol  (A2C):   37.2 ml 17.79 ml/m RA Volume:   20.40 ml 9.76 ml/m LA Vol (A4C):   32.9 ml 15.73 ml/m LA Biplane Vol: 34.8 ml 16.64 ml/m   AORTA Ao Root diam: 2.90 cm MITRAL VALVE MV Area (PHT): 3.16 cm    SHUNTS MV Decel Time: 240 msec    Systemic Diam: 2.10 cm MV E velocity: 94.70 cm/s MV A velocity: 97.30 cm/s MV E/A ratio:  0.97 Gwyndolyn Kaufman MD Electronically signed by Gwyndolyn Kaufman MD Signature Date/Time: 07/10/2020/1:22:51 PM    Final    CT HEAD CODE STROKE WO CONTRAST  Result Date: 07/09/2020 CLINICAL DATA:  Code stroke.  Left-sided weakness. EXAM: CT HEAD WITHOUT CONTRAST TECHNIQUE: Contiguous axial images were obtained from the base of the skull through the vertex without intravenous contrast. COMPARISON:  10/15/2017 FINDINGS: Brain: There is some sort a of artifact in the posterior fossa. Cerebellar and lower occipital infarction can not be excluded. Otherwise, cerebral hemispheres do not show any evidence of acute infarction. Mild age related volume loss. No hemorrhage, hydrocephalus or extra-axial collection. Vascular: There is atherosclerotic calcification of the major vessels at the base of the brain. Skull: Chronic lucent area in the right frontal region, consistent with a benign finding. Sinuses/Orbits: Clear/normal Other: None ASPECTS ([place] Stroke Program Early CT Score) - Ganglionic level infarction (caudate, lentiform nuclei, internal capsule, insula, M1-M3 cortex): 7 - Supraganglionic infarction (M4-M6 cortex): 3 Total score (0-10 with 10 being normal): 10 IMPRESSION: 1. Some sort of artifact in the posterior fossa. Cerebellar and lower occipital infarction can not be excluded. Otherwise, no acute finding. 2. ASPECTS is 10. 3. These results were communicated to Dr. Cheral Marker at [place] 12/17/2021by text page via the [facility] messaging system. Electronically Signed   By: Nelson Chimes M.D.   On: 07/09/2020 20:12   CT ANGIO HEAD CODE STROKE  Result Date: 07/09/2020 CLINICAL DATA:   Left-sided weakness EXAM: CT ANGIOGRAPHY HEAD AND NECK CT PERFUSION BRAIN TECHNIQUE: Multidetector CT imaging of the head and neck was performed using the standard protocol during bolus administration of intravenous contrast. Multiplanar CT image reconstructions and MIPs were obtained to evaluate the vascular anatomy. Carotid stenosis measurements (when applicable) are obtained utilizing NASCET criteria, using the distal internal carotid diameter as the denominator. Multiphase CT imaging of the brain was performed following IV bolus contrast injection. Subsequent parametric perfusion maps were calculated using RAPID software. CONTRAST:  139mL OMNIPAQUE IOHEXOL 350 MG/ML SOLN COMPARISON:  Head CT same day FINDINGS: CTA NECK FINDINGS Aortic arch: Aortic atherosclerosis. Branching pattern is normal without origin stenosis. Right carotid system: Normal common  carotid artery and bifurcation. Cervical ICA is tortuous but widely patent. Left carotid system: Normal Vertebral arteries: Vertebral artery origins widely patent. Both vertebral arteries appear normal through the cervical region to the foramen magnum. Skeleton: Ordinary cervical spondylosis. Other neck: No mass or lymphadenopathy. Upper chest: Emphysema and pulmonary scarring. Review of the MIP images confirms the above findings CTA HEAD FINDINGS Anterior circulation: Both internal carotid arteries widely patent through the skull base and siphon regions. The anterior and middle cerebral vessels are normal. No large or medium vessel occlusion. Posterior circulation: Both vertebral arteries widely patent to the basilar. No basilar stenosis. Posterior circulation branch vessels appear normal. Venous sinuses: Patent and normal. Anatomic variants: None significant. Review of the MIP images confirms the above findings CT Brain Perfusion Findings: ASPECTS: 10 CBF (<30%) Volume: 75m Perfusion (Tmax>6.0s) volume: 023mMismatch Volume: 11m29mnfarction Location:None  IMPRESSION: 1. Normal CT angiography of the head and neck vessels. 2. Negative perfusion study. 3. Emphysema and aortic atherosclerosis. Aortic Atherosclerosis (ICD10-I70.0) and Emphysema (ICD10-J43.9). Electronically Signed   By: MarNelson ChimesD.   On: 07/09/2020 20:40   CT ANGIO NECK CODE STROKE  Result Date: 07/09/2020 CLINICAL DATA:  Left-sided weakness EXAM: CT ANGIOGRAPHY HEAD AND NECK CT PERFUSION BRAIN TECHNIQUE: Multidetector CT imaging of the head and neck was performed using the standard protocol during bolus administration of intravenous contrast. Multiplanar CT image reconstructions and MIPs were obtained to evaluate the vascular anatomy. Carotid stenosis measurements (when applicable) are obtained utilizing NASCET criteria, using the distal internal carotid diameter as the denominator. Multiphase CT imaging of the brain was performed following IV bolus contrast injection. Subsequent parametric perfusion maps were calculated using RAPID software. CONTRAST:  1011m17mNIPAQUE IOHEXOL 350 MG/ML SOLN COMPARISON:  Head CT same day FINDINGS: CTA NECK FINDINGS Aortic arch: Aortic atherosclerosis. Branching pattern is normal without origin stenosis. Right carotid system: Normal common carotid artery and bifurcation. Cervical ICA is tortuous but widely patent. Left carotid system: Normal Vertebral arteries: Vertebral artery origins widely patent. Both vertebral arteries appear normal through the cervical region to the foramen magnum. Skeleton: Ordinary cervical spondylosis. Other neck: No mass or lymphadenopathy. Upper chest: Emphysema and pulmonary scarring. Review of the MIP images confirms the above findings CTA HEAD FINDINGS Anterior circulation: Both internal carotid arteries widely patent through the skull base and siphon regions. The anterior and middle cerebral vessels are normal. No large or medium vessel occlusion. Posterior circulation: Both vertebral arteries widely patent to the basilar. No  basilar stenosis. Posterior circulation branch vessels appear normal. Venous sinuses: Patent and normal. Anatomic variants: None significant. Review of the MIP images confirms the above findings CT Brain Perfusion Findings: ASPECTS: 10 CBF (<30%) Volume: 11mL 26mfusion (Tmax>6.0s) volume: 11mL M58match Volume: 11mL In38mction Location:None IMPRESSION: 1. Normal CT angiography of the head and neck vessels. 2. Negative perfusion study. 3. Emphysema and aortic atherosclerosis. Aortic Atherosclerosis (ICD10-I70.0) and Emphysema (ICD10-J43.9). Electronically Signed   By: Mark  SNelson Chimes On: 07/09/2020 20:40        Scheduled Meds: . aspirin  325 mg Oral Daily  . chlorhexidine gluconate (MEDLINE KIT)  15 mL Mouth Rinse BID  . Chlorhexidine Gluconate Cloth  6 each Topical Q0600  . docusate sodium  100 mg Oral BID  . enoxaparin (LOVENOX) injection  40 mg Subcutaneous Q24H  . ipratropium-albuterol  3 mL Nebulization Q6H  . pantoprazole  40 mg Oral Daily  . polyethylene glycol  17 g Oral Daily   Continuous Infusions: . azithromycin  Stopped (07/11/20 0350)  . ceFEPime (MAXIPIME) IV 2 g (07/11/20 0630)     LOS: 2 days    Time spent: 35 minutes    Polina Burmaster A Ezechiel Stooksbury, MD [facility]   If 7PM-7AM, please contact night-coverage [email or web address]  07/11/2020, 7:10 AM

## 2020-07-11 NOTE — Evaluation (Signed)
Physical Therapy Evaluation  Patient Details Name: Kyle Payne MRN: [id number] DOB: Jan 01, 1941 Today's Date: 07/11/2020   History of Present Illness  Pt is a 79 y/o male who presents with sudden onset LE weakness - first L LE and then R LE. He had left facial droop and mildly slurred speech, which per his sister slurred speech is his baseline. He had weakness in all extremities upon presentation and had progressive encephalopathy. He was intubated for airway protection 12/17, extubated 12/18. Brain MRI negative for acute changes. CXR 12/18 concerning for BLL infitrates. PMH significant for COPD, rotator cuff repair, inguinal hernia repair.    Clinical Impression  Pt admitted with above diagnosis. At the time of PT eval pt was able to perform transfers and ambulation with gross min guard assist for balance support, and therapist assist for line management. VC's throughout session for pursed-lip breathing, but pt was able to tolerate ~200' ambulation around unit without drop in O2 sats below 90%. Pt currently with functional limitations due to the deficits listed below (see PT Problem List). Pt will benefit from skilled PT to increase their independence and safety with mobility to allow discharge to the venue listed below.       Follow Up Recommendations Home health PT;Supervision for mobility/OOB    Equipment Recommendations  None recommended by PT (May want to consider a RW if does not progress gait training next session.)    Recommendations for Other Services       Precautions / Restrictions Precautions Precautions: Fall Precaution Comments: watch O2 Restrictions Weight Bearing Restrictions: No      Mobility  Bed Mobility               General bed mobility comments: Pt was received sitting up in the recliner. RN reports pt with desaturation during transfer to the chair.    Transfers Overall transfer level: Needs assistance Equipment used: None Transfers: Sit to/from  Stand Sit to Stand: Min guard;Supervision         General transfer comment: Therapist managed lines, but pt was able to power-up to full stand with hands-on guarding but without assistance. At end of session pt stand>sit with supervision for safety.  Ambulation/Gait Ambulation/Gait assistance: Min guard;+2 safety/equipment Gait Distance (Feet): 200 Feet Assistive device: None Gait Pattern/deviations: Step-through pattern;Decreased stride length;Narrow base of support Gait velocity: Decreased Gait velocity interpretation: <1.31 ft/sec, indicative of household ambulator General Gait Details: Slow and guarded with short step/stride length. Pt was able to improve gait pattern with cues but still very slow. R LE internally rotated - likely baseline. O2 sats maintained in low-mid 90's on 4L/miin supplemental O2, with occasional cues for pursed-lip breathing.  Stairs            Wheelchair Mobility    Modified Rankin (Stroke Patients Only) Modified Rankin (Stroke Patients Only) Pre-Morbid Rankin Score: No symptoms Modified Rankin: Moderately severe disability     Balance Overall balance assessment: Needs assistance Sitting-balance support: Feet supported;No upper extremity supported Sitting balance-Leahy Scale: Good     Standing balance support: No upper extremity supported;During functional activity Standing balance-Leahy Scale: Fair Standing balance comment: No UE support required however appeared guarded.                             Pertinent Vitals/Pain Pain Assessment: No/denies pain    Home Living Family/patient expects to be discharged to:: Private residence Living Arrangements: Spouse/significant other Available Help at Discharge:  Family;Available 24 hours/day Type of Home: House Home Access: Stairs to enter Entrance Stairs-Rails: Doctor, general practice of Steps: 2 Home Layout: One level Home Equipment: Walker - 4 wheels;Walker - 2  wheels;Shower seat;Hand held shower head Additional Comments: Lives with wife, however has 2 sisters that live very close (at least 1 is next door). Wife with PMH of CVA and breast CA, and pt reports she has difficulty walking. However, he does not have to assist her with anything at home, and sisters will be available if pt needs physical assist.    Prior Function Level of Independence: Independent         Comments: Drives, gardens and works in the yard a lot.     Hand Dominance        Extremity/Trunk Assessment   Upper Extremity Assessment Upper Extremity Assessment: Defer to OT evaluation    Lower Extremity Assessment Lower Extremity Assessment: Overall WFL for tasks assessed (sensation equal R and L)    Cervical / Trunk Assessment Cervical / Trunk Assessment: Other exceptions Cervical / Trunk Exceptions: Forward head posture and rounded shoulders  Communication   Communication: Other (comment) (slurred speech baseline per chart review)  Cognition Arousal/Alertness: Awake/alert Behavior During Therapy: WFL for tasks assessed/performed Overall Cognitive Status: Within Functional Limits for tasks assessed                                        General Comments      Exercises     Assessment/Plan    PT Assessment Patient needs continued PT services  PT Problem List Decreased strength;Decreased activity tolerance;Decreased balance;Decreased mobility;Decreased knowledge of use of DME;Decreased safety awareness;Decreased knowledge of precautions;Cardiopulmonary status limiting activity       PT Treatment Interventions DME instruction;Stair training;Gait training;Functional mobility training;Therapeutic activities;Therapeutic exercise;Neuromuscular re-education;Patient/family education    PT Goals (Current goals can be found in the Care Plan section)  Acute Rehab PT Goals Patient Stated Goal: Return home ASAP PT Goal Formulation: With  patient/family Time For Goal Achievement: 07/18/20 Potential to Achieve Goals: Good    Frequency Min 3X/week   Barriers to discharge        Co-evaluation PT/OT/SLP Co-Evaluation/Treatment: Yes Reason for Co-Treatment: To address functional/ADL transfers PT goals addressed during session: Mobility/safety with mobility;Balance         AM-PAC PT "6 Clicks" Mobility  Outcome Measure Help needed turning from your back to your side while in a flat bed without using bedrails?: None Help needed moving from lying on your back to sitting on the side of a flat bed without using bedrails?: A Little Help needed moving to and from a bed to a chair (including a wheelchair)?: A Little Help needed standing up from a chair using your arms (e.g., wheelchair or bedside chair)?: A Little Help needed to walk in hospital room?: A Little Help needed climbing 3-5 steps with a railing? : A Little 6 Click Score: 19    End of Session Equipment Utilized During Treatment: Gait belt;Oxygen Activity Tolerance: Patient tolerated treatment well Patient left: in chair;with call bell/phone within reach;with family/visitor present Nurse Communication: Mobility status PT Visit Diagnosis: Unsteadiness on feet (R26.81);Difficulty in walking, not elsewhere classified (R26.2)    Time: 9562-1308 PT Time Calculation (min) (ACUTE ONLY): 25 min   Charges:   PT Evaluation $PT Eval Moderate Complexity: 1 Mod PT Treatments $Gait Training: 8-22 mins  Conni Slipper, PT, DPT Acute Rehabilitation Services Pager: [phone] Office: [phone]   Marylynn Pearson 07/11/2020, 9:01 AM

## 2020-07-12 ENCOUNTER — Inpatient Hospital Stay (HOSPITAL_COMMUNITY): Payer: Medicare HMO

## 2020-07-12 LAB — CBC
HCT: 41.4 % (ref 39.0–52.0)
Hemoglobin: 13.2 g/dL (ref 13.0–17.0)
MCH: 29.9 pg (ref 26.0–34.0)
MCHC: 31.9 g/dL (ref 30.0–36.0)
MCV: 93.9 fL (ref 80.0–100.0)
Platelets: 181 10*3/uL (ref 150–400)
RBC: 4.41 MIL/uL (ref 4.22–5.81)
RDW: 13.4 % (ref 11.5–15.5)
WBC: 7.4 10*3/uL (ref 4.0–10.5)
nRBC: 0 % (ref 0.0–0.2)

## 2020-07-12 LAB — CULTURE, RESPIRATORY W GRAM STAIN: Culture: NORMAL

## 2020-07-12 LAB — BASIC METABOLIC PANEL
Anion gap: 8 (ref 5–15)
BUN: 11 mg/dL (ref 8–23)
CO2: 28 mmol/L (ref 22–32)
Calcium: 8.8 mg/dL — ABNORMAL LOW (ref 8.9–10.3)
Chloride: 103 mmol/L (ref 98–111)
Creatinine, Ser: 0.73 mg/dL (ref 0.61–1.24)
GFR, Estimated: >60 mL/min (ref >60)
Glucose, Bld: 107 mg/dL — ABNORMAL HIGH (ref 70–99)
Potassium: 3.3 mmol/L — ABNORMAL LOW (ref 3.5–5.1)
Sodium: 139 mmol/L (ref 135–145)

## 2020-07-12 LAB — T4, FREE: Free T4: 0.82 ng/dL (ref 0.61–1.12)

## 2020-07-12 LAB — LEGIONELLA PNEUMOPHILA SEROGP 1 UR AG: L. pneumophila Serogp 1 Ur Ag: NEGATIVE

## 2020-07-12 LAB — GLUCOSE, CAPILLARY: Glucose-Capillary: 77 mg/dL (ref 70–99)

## 2020-07-12 MED ORDER — POTASSIUM CHLORIDE CRYS ER 20 MEQ PO TBCR
40.0000 meq | EXTENDED_RELEASE_TABLET | Freq: Once | ORAL | Status: AC
  Administered 2020-07-12: 08:00:00 40 meq via ORAL
  Filled 2020-07-12: qty 2

## 2020-07-12 MED ORDER — AZITHROMYCIN 500 MG PO TABS
500.0000 mg | ORAL_TABLET | Freq: Every day | ORAL | Status: DC
  Administered 2020-07-13: 09:00:00 500 mg via ORAL
  Filled 2020-07-12: qty 1

## 2020-07-12 MED ORDER — SIMVASTATIN 20 MG PO TABS
20.0000 mg | ORAL_TABLET | Freq: Every day | ORAL | Status: DC
  Administered 2020-07-12: 17:00:00 20 mg via ORAL
  Filled 2020-07-12 (×2): qty 1

## 2020-07-12 NOTE — TOC Initial Note (Addendum)
Transition of Care [facility]) - Initial/Assessment Note    Patient Details  Name: Kyle Payne MRN: [id number] Date of Birth: [birth year]  [facility] CM/SW Contact:    Bess Kinds, RN Phone Number: [phone] 07/12/2020, 12:20 PM  Clinical Narrative:                  Spoke with patient and sister at the bedside. PTA home with spouse. Stated that one of his sisters will provide transportation home at discharge. Verified PCP and preferred pharmacy in [place] as correct. At baseline patient is independent with basic adls and continues to drive. Discussed need for home oxygen - discussed choice of dme providers. Referral faxed to [facility] Patsy Lager) in [place] [phone]. TOC following for transition needs.   13:00 pm - Received confirmation from [facility] in [place] that referral was received. Oxygen for transporation will be brought to patient at the hospital.  Expected Discharge Plan: Home/Self Care Barriers to Discharge: Continued Medical Work up   Patient Goals and CMS Choice Patient states their goals for this hospitalization and ongoing recovery are:: back home with wife CMS Medicare.gov Compare Post Acute Care list provided to:: Patient Choice offered to / list presented to : Patient  Expected Discharge Plan and Services Expected Discharge Plan: Home/Self Care In-house Referral: NA Discharge Planning Services: CM Consult Post Acute Care Choice: Durable Medical Equipment Living arrangements for the past 2 months: Single Family Home                 DME Arranged: Oxygen DME Agency: Patsy Lager ([facility]) Date DME Agency Contacted: 07/12/20 Time DME Agency Contacted: [phone]   HH Arranged: NA HH Agency: NA        Prior Living Arrangements/Services Living arrangements for the past 2 months: Single Family Home Lives with:: Self,Spouse Patient language and need for interpreter reviewed:: Yes Do you feel safe going back to the  place where you live?: Yes      Need for Family Participation in Patient Care: Yes (Comment) Care giver support system in place?: Yes (comment) Current home services: DME (walker) Criminal Activity/Legal Involvement Pertinent to Current Situation/Hospitalization: No - Comment as needed  Activities of Daily Living      Permission Sought/Granted Permission sought to share information with : Family Supports Permission granted to share information with : Yes, Verbal Permission Granted  Share Information with NAME: Azeez Dunker     Permission granted to share info w Relationship: spouse  Permission granted to share info w Contact Information: [phone]  Emotional Assessment Appearance:: Appears stated age Attitude/Demeanor/Rapport: Engaged Affect (typically observed): Accepting Orientation: : Oriented to Self,Oriented to  Time,Oriented to Place,Oriented to Situation Alcohol / Substance Use: Not Applicable Psych Involvement: No (comment)  Admission diagnosis:  Encounter for imaging study to confirm nasogastric (NG) tube placement [Z01.89] Acute respiratory failure with hypoxia and hypercapnia (HCC) [J96.01, J96.02] Altered mental status, unspecified altered mental status type [R41.82] Patient Active Problem List   Diagnosis Date Noted  . Altered mental status   . Acute respiratory failure with hypoxia and hypercapnia (HCC) 07/09/2020  . Syncope 10/30/2017  . GERD (gastroesophageal reflux disease) 01/28/2016  . Hyperlipidemia, mixed 01/28/2016  . Obstructive sleep apnea 01/28/2016   PCP:  Olive Bass, MD Pharmacy:   [facility] - [place], [place] - [address] [place] [address] Phone: [phone] Fax: [phone]  [facility](NOT [facility]) - [place], [place] -  [address] [place] [address] Phone: [phone] Fax: [phone]  [facility] - [place], [place] - [address] [place] [address] Phone: [phone] Fax: [phone]     Social Determinants of Health (SDOH) Interventions    Readmission Risk Interventions No flowsheet data found.

## 2020-07-12 NOTE — Progress Notes (Signed)
New swelling noted in right hand plus + edema, warm and red to touch, pulse noted. MD. Sunnie Nielsen is aware, IV site in the right hand remove. Hand elevated,  will continue to monitor.

## 2020-07-12 NOTE — Progress Notes (Signed)
Physical Therapy Treatment Patient Details Name: Kyle Payne MRN: [id number] DOB: [birth year] Today's Date: 07/12/2020    History of Present Illness Pt is a 79 y/o male who presents with sudden onset LE weakness - first L LE and then R LE. He had left facial droop and mildly slurred speech. He was intubated for airway protection 12/17, extubated 12/18. Brain MRI negative for acute changes. CXR 12/18 concerning for BLL infitrates. PMH significant for COPD, rotator cuff repair, inguinal hernia repair.    PT Comments    Pt very pleasant, moving well and demonstrating increased gait, ability to ascend stairs and need for 2L with mobility. Bil UE and LE strength 5/5 today with Rt hand edema. Pt reports return to baseline functional status with need for supplemental O2 without strength or balance deficits and appropriate for return home with recommendation for home pulse ox.  Spo2 on RA at rest 94% Drop to 86% on RA with gait 2L 89-92% with gait  HR 92-122 with activity    Follow Up Recommendations  No PT follow up     Equipment Recommendations  Other (comment) (home pulse ox)    Recommendations for Other Services       Precautions / Restrictions Precautions Precautions: Other (comment) Precaution Comments: watch O2    Mobility  Bed Mobility               General bed mobility comments: in recliner before and after session  Transfers Overall transfer level: Modified independent               General transfer comment: pt with appropriate hand placement for transfers  Ambulation/Gait Ambulation/Gait assistance: Supervision Gait Distance (Feet): 400 Feet Assistive device: None Gait Pattern/deviations: Step-through pattern;Decreased stride length   Gait velocity interpretation: >2.62 ft/sec, indicative of community ambulatory General Gait Details: pt with steady gait with drop in O2 on RA to 86% after 100' with standing rest and 2L for remainder of gait to  maintain 89-93%   Stairs Stairs: Yes Stairs assistance: Modified independent (Device/Increase time) Stair Management: Alternating pattern;Forwards;Backwards Number of Stairs: 2 General stair comments: pt ascending forward and backing down   Wheelchair Mobility    Modified Rankin (Stroke Patients Only)       Balance Overall balance assessment: No apparent balance deficits (not formally assessed)                                          Cognition Arousal/Alertness: Awake/alert Behavior During Therapy: WFL for tasks assessed/performed Overall Cognitive Status: Within Functional Limits for tasks assessed                                        Exercises      General Comments        Pertinent Vitals/Pain Pain Assessment: No/denies pain    Home Living                      Prior Function            PT Goals (current goals can now be found in the care plan section) Progress towards PT goals: Progressing toward goals    Frequency    Min 3X/week      PT Plan Current plan remains appropriate  Co-evaluation              AM-PAC PT "6 Clicks" Mobility   Outcome Measure  Help needed turning from your back to your side while in a flat bed without using bedrails?: None Help needed moving from lying on your back to sitting on the side of a flat bed without using bedrails?: None Help needed moving to and from a bed to a chair (including a wheelchair)?: None Help needed standing up from a chair using your arms (e.g., wheelchair or bedside chair)?: None Help needed to walk in hospital room?: A Little Help needed climbing 3-5 steps with a railing? : None 6 Click Score: 23    End of Session Equipment Utilized During Treatment: Gait belt;Oxygen Activity Tolerance: Patient tolerated treatment well Patient left: in chair;with call bell/phone within reach;with nursing/sitter in room Nurse Communication: Mobility  status PT Visit Diagnosis: Other abnormalities of gait and mobility (R26.89)     Time: 2694-8546 PT Time Calculation (min) (ACUTE ONLY): 18 min  Charges:  $Gait Training: 8-22 mins                     Cari Vandeberg P, PT Acute Rehabilitation Services Pager: [phone] Office: [phone]    Enedina Finner Etola Mull 07/12/2020, 8:24 AM

## 2020-07-12 NOTE — Progress Notes (Addendum)
PROGRESS NOTE    JOB HOLTSCLAW  VOZ:[id number] DOB: 12-21-1940 DOA: 07/09/2020 PCP: Algis Greenhouse, MD   Brief Narrative: 79 year old with past medical history significant for obesity, OSA not on CPAP, COPD, hyperlipidemia, who presented with sudden onset of leg weakness, for his left leg and then his right leg.  History was obtained from sister who was at bedside.  Patient had left facial droop and mildly slurred speech which per sister slurred speech is his baseline.  He had weakness in whole extremity upon presentation and progressive encephalopathy.  Patient developed somnolence and snoring with likely obstructive apnea when he laid flat in the ED.  Patient was intubated for airway protection.  CT head was negative for CVA.  He had transient hypotension due to sedation responsive to short-term pressors and IV fluids.  Patient has not been sick recently.  No new medication.  Intubated on admission 12/17.  Neurology was consulted.  Patient was able to be extubated on 12/18.  Transfer to hospitalist care 12/19.    Assessment & Plan:   Active Problems:   Acute respiratory failure with hypoxia and hypercapnia (HCC)   Altered mental status   1-Acute Respiratory Failure with Hypoxia and Hypercapnia: secondary to PNA Ph 7.2, PCO2; 60 Precipitated by mental status changes or bibasilar pneumonia versus aspiration. Continue with nebulizer. Continue with cefepime and azithromycin for pneumonia for 5 days. Urine Legionella antigen: Pending Urinary pneumococcal antigen negative Tracheal culture; Few normal respiratory flora.  He will need home oxygen.   2-Acute Metabolic encephalopathy: secondary to hypercapnia versus sepsis acute diffuse weakness: Neurology following. CT head; artifact in the posterior fossa.  Cerebellar and lower occipital infarction cannot be excluded.  No acute finding. CTA head and neck: Normal.  Negative perfusion study. MRI Brain: No acute or reversible finding.   Mild chronic small vessel ischemic changes.  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  LDL 133, HbA1c 5.8, ECHO normal EF, no thrombus, .  Resolved.   3-Sepsis; PNA;  Chest x ray ; Bilateral infiltrates.  Patient presents with encephalopathy, hypothermia T 94, Tachypnea RR 25, subsequently tachycardic.  Tracheal culture; growing gram-positive cocci and rare gram-negative rods. Continue with cefepime and azithromycin. Chest x ray repeated, negatieve for pleural effusion.   Hypoglycemia: Resolved  4-elevated TSH:5.8 Free T 4 normal.   OSA not on CPAP, COPD: Continue with nebulizer twice daily.  Hypotension: He was transiently on Levophed.  This was thought to be related to sedation.  Resolved  Neuropathy: Continue with  lower home dose gabapentin,  resume amitriptyline. Hypokalemia; replete orally.   Estimated body mass index is 25.3 kg/m as calculated from the following:   Height as of this encounter: 6' (1.829 m).   Weight as of this encounter: 84.6 kg.   DVT prophylaxis: Lovenox Code Status: Full code Family Communication: Care discussed with patient and sister who was at bedside Disposition Plan:  Status is: Inpatient  Remains inpatient appropriate because:IV treatments appropriate due to intensity of illness or inability to take PO   Dispo: The patient is from: Home              Anticipated d/c is to: Awaiting PT OT recommendation              Anticipated d/c date is: 2 days              Patient currently is not medically stable to d/c.        Consultants:   Neurology  CCM  admitted patient    Procedures:   Endotracheal intubation 12/17  Antimicrobials:  Cefepime and azithromycin 12/17  Subjective: He is feeling well, denies dyspnea or cough.   Objective: Vitals:   07/12/20 0300 07/12/20 0400 07/12/20 0500 07/12/20 0600  BP: (!) 124/112 112/69 127/75 105/72  Pulse: 86 73 (!) 103 90  Resp: (!) _0 Temp:   98.2 F (36.8 C)    TempSrc:   Oral   SpO2: 94% 92% 94% 95%  Weight:   84.6 kg   Height:        Intake/Output Summary (Last 24 hours) at 07/12/2020 8588 Last data filed at 07/11/2020 2300 Gross per 24 hour  Intake 320 ml  Output 550 ml  Net -230 ml   Filed Weights   07/10/20 0405 07/11/20 0500 07/12/20 0500  Weight: 86.8 kg 87 kg 84.6 kg    Examination:  General exam: NAD Respiratory system: No wheezing, few ronchus Cardiovascular system: SS 1, S 2 RRR Gastrointestinal system: BS present, soft, nt Extremities; no edema Right had with mild redness at IV site. Plan to remove IV  Data Reviewed: I have personally reviewed following labs and imaging studies  CBC: Recent Labs  Lab 07/09/20 1925 07/09/20 1928 07/09/20 2145 07/10/20 0119 07/10/20 0527 07/11/20 0322 07/12/20 0153  WBC 5.8  --   --  7.0  --  8.1 7.4  NEUTROABS 3.2  --   --   --   --   --   --   HGB 14.2   < > 13.6 13.0 14.3 13.1 13.2  HCT 45.3   < > 40.0 42.0 42.0 41.2 41.4  MCV 94.8  --   --  96.1  --  93.8 93.9  PLT 193  --   --  161  --  180 181   < > = values in this interval not displayed.   Basic Metabolic Panel: Recent Labs  Lab 07/09/20 1925 07/09/20 1928 07/09/20 2145 07/10/20 0119 07/10/20 0527 07/11/20 0322 07/12/20 0153  NA 138 138 137 138 139 139 139  K 3.8 3.9 4.0 4.5 3.2* 3.7 3.3*  CL 99 100  --  105  --  105 103  CO2 27  --   --  23  --  25 28  GLUCOSE 97 94  --  91  --  106* 107*  BUN 19 23  --  17  --  15 11  CREATININE 1.09 1.00  --  1.00  --  0.69 0.73  CALCIUM 9.6  --   --  8.5*  --  8.8* 8.8*   GFR: Estimated Creatinine Clearance: 82.2 mL/min (by C-G formula based on SCr of 0.73 mg/dL). Liver Function Tests: Recent Labs  Lab 07/09/20 1925  AST 22  ALT 21  ALKPHOS 49  BILITOT 0.6  PROT 7.1  ALBUMIN 4.1   No results for input(s): LIPASE, AMYLASE in the last 168 hours. No results for input(s): AMMONIA in the last 168 hours. Coagulation Profile: Recent Labs  Lab 07/09/20 1925   INR 1.0   Cardiac Enzymes: No results for input(s): CKTOTAL, CKMB, CKMBINDEX, TROPONINI in the last 168 hours. BNP (last 3 results) No results for input(s): PROBNP in the last 8760 hours. HbA1C: Recent Labs    07/11/20 0322  HGBA1C 5.8*   CBG: Recent Labs  Lab 07/09/20 1943 07/10/20 0152 07/10/20 0326 07/10/20 0732 07/10/20 0858  GLUCAP 93 79 84 69* 115*   Lipid Profile: Recent Labs  07/10/20 0119 07/11/20 0322  CHOL  --  182  HDL  --  38*  LDLCALC  --  133*  TRIG 86 53  54  CHOLHDL  --  4.8   Thyroid Function Tests: Recent Labs    07/09/20 2335 07/12/20 0153  TSH 5.855*  --   FREET4  --  0.82   Anemia Panel: No results for input(s): VITAMINB12, FOLATE, FERRITIN, TIBC, IRON, RETICCTPCT in the last 72 hours. Sepsis Labs: Recent Labs  Lab 07/09/20 2335 07/10/20 0119  LATICACIDVEN 1.4 1.9    Recent Results (from the past 240 hour(s))  Resp Panel by RT-PCR (Flu A&B, Covid) Nasopharyngeal Swab     Status: None   Collection Time: 07/09/20  7:34 PM   Specimen: Nasopharyngeal Swab; Nasopharyngeal(NP) swabs in vial transport medium  Result Value Ref Range Status   SARS Coronavirus 2 by RT PCR NEGATIVE NEGATIVE Final    Comment: (NOTE) SARS-CoV-2 target nucleic acids are NOT DETECTED.  The SARS-CoV-2 RNA is generally detectable in upper respiratory specimens during the acute phase of infection. The lowest concentration of SARS-CoV-2 viral copies this assay can detect is 138 copies/mL. A negative result does not preclude SARS-Cov-2 infection and should not be used as the sole basis for treatment or other patient management decisions. A negative result may occur with  improper specimen collection/handling, submission of specimen other than nasopharyngeal swab, presence of viral mutation(s) within the areas targeted by this assay, and inadequate number of viral copies(<138 copies/mL). A negative result must be combined with clinical observations, patient  history, and epidemiological information. The expected result is Negative.  Fact Sheet for Patients:  EntrepreneurPulse.com.au  Fact Sheet for Healthcare Providers:  IncredibleEmployment.be  This test is no t yet approved or cleared by the [place] FDA and  has been authorized for detection and/or diagnosis of SARS-CoV-2 by FDA under an Emergency Use Authorization (EUA). This EUA will remain  in effect (meaning this test can be used) for the duration of the COVID-19 declaration under Section 564(b)(1) of the Act, 21 U.S.C.section 360bbb-3(b)(1), unless the authorization is terminated  or revoked sooner.       Influenza A by PCR NEGATIVE NEGATIVE Final   Influenza B by PCR NEGATIVE NEGATIVE Final    Comment: (NOTE) The Xpert Xpress SARS-CoV-2/FLU/RSV plus assay is intended as an aid in the diagnosis of influenza from Nasopharyngeal swab specimens and should not be used as a sole basis for treatment. Nasal washings and aspirates are unacceptable for Xpert Xpress SARS-CoV-2/FLU/RSV testing.  Fact Sheet for Patients: EntrepreneurPulse.com.au  Fact Sheet for Healthcare Providers: IncredibleEmployment.be  This test is not yet approved or cleared by the [place] FDA and has been authorized for detection and/or diagnosis of SARS-CoV-2 by FDA under an Emergency Use Authorization (EUA). This EUA will remain in effect (meaning this test can be used) for the duration of the COVID-19 declaration under Section 564(b)(1) of the Act, 21 U.S.C. section 360bbb-3(b)(1), unless the authorization is terminated or revoked.  Performed at [facility], [place] [address] [place], [place] [address]   Culture, blood (routine x 2)     Status: None (Preliminary result)   Collection Time: 07/09/20 11:35 PM   Specimen: BLOOD RIGHT HAND  Result Value Ref Range Status   Specimen Description BLOOD RIGHT HAND  Final    Special Requests   Final    BOTTLES DRAWN AEROBIC AND ANAEROBIC Blood Culture results may not be optimal due to an inadequate volume of  blood received in culture bottles   Culture   Final    NO GROWTH 2 DAYS Performed at [facility], [place] [address] [place], [place] [address]    Report Status PENDING  Incomplete  Culture, blood (routine x 2)     Status: None (Preliminary result)   Collection Time: 07/09/20 11:35 PM   Specimen: BLOOD LEFT HAND  Result Value Ref Range Status   Specimen Description BLOOD LEFT HAND  Final   Special Requests   Final    BOTTLES DRAWN AEROBIC AND ANAEROBIC Blood Culture results may not be optimal due to an inadequate volume of blood received in culture bottles   Culture   Final    NO GROWTH 2 DAYS Performed at [facility], [place] [address] [place], [place] [address]    Report Status PENDING  Incomplete  Culture, respiratory (non-expectorated)     Status: None   Collection Time: 07/09/20 11:35 PM   Specimen: Tracheal Aspirate; Respiratory  Result Value Ref Range Status   Specimen Description TRACHEAL ASPIRATE  Final   Special Requests NONE  Final   Gram Stain   Final    RARE WBC PRESENT, PREDOMINANTLY PMN ABUNDANT GRAM POSITIVE COCCI RARE GRAM NEGATIVE RODS    Culture   Final    FEW Normal respiratory flora-no Staph aureus or Pseudomonas seen Performed at [facility], [address], [place], [place] [address]    Report Status 07/12/2020 FINAL  Final  MRSA PCR Screening     Status: None   Collection Time: 07/10/20 12:40 AM   Specimen: Nasopharyngeal  Result Value Ref Range Status   MRSA by PCR NEGATIVE NEGATIVE Final    Comment:        The GeneXpert MRSA Assay (FDA approved for NASAL specimens only), is one component of a comprehensive MRSA colonization surveillance program. It is not intended to diagnose MRSA infection nor to guide or monitor treatment for MRSA infections. Performed at [facility], [place]  [address], [place], [place] [address]          Radiology Studies: MR BRAIN WO CONTRAST  Result Date: 07/10/2020 CLINICAL DATA:  Sudden onset of left leg more than right leg weakness. EXAM: MRI HEAD WITHOUT CONTRAST TECHNIQUE: Multiplanar, multiecho pulse sequences of the brain and surrounding structures were obtained without intravenous contrast. COMPARISON:  CT studies done yesterday. FINDINGS: Brain: Diffusion imaging does not show any acute or subacute infarction. The brainstem and cerebellum are normal. Old small vessel infarction in the left thalamus. Mild chronic small-vessel ischemic changes affect the cerebral hemispheric white matter. No cortical or large vessel territory infarction. No mass lesion, hemorrhage, hydrocephalus or extra-axial collection. Vascular: Major vessels at the base of the brain show flow. Skull and upper cervical spine: Negative Sinuses/Orbits: Clear/normal Other: None IMPRESSION: No acute or reversible finding. No specific cause of the presenting symptoms is identified. Mild chronic small-vessel ischemic changes throughout the brain as outlined above. 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 Electronically Signed   By: Nelson Chimes M.D.   On: 07/10/2020 15:27   ECHOCARDIOGRAM COMPLETE  Result Date: 07/10/2020    ECHOCARDIOGRAM REPORT   Patient Name:   Kyle Payne Date of Exam: 07/10/2020 Medical Rec #:  [id number]     Height:       72.0 in Accession #:    [id number]    Weight:       191.4 lb Date of Birth:  Nov 28, 1940  BSA:          2.091 m Patient Age:    30 years      BP:           112/84 mmHg Patient Gender: M             HR:           76 bpm. Exam Location:  Inpatient Procedure: 2D Echo Indications:    TIA 435.9 / G45.9  History:        Patient has no prior history of Echocardiogram examinations.                 Signs/Symptoms:Syncope; Risk Factors:Dyslipidemia and Former                 Smoker. Acute respiratory failure with hypoxia and hypercapnia.  Sonographer:     Leavy Cella Referring Phys: [id number] [place]  1. Left ventricular ejection fraction, by estimation, is 60 to 65%. The left ventricle has normal function. The left ventricle has no regional wall motion abnormalities. There is mild concentric left ventricular hypertrophy. Left ventricular diastolic parameters are consistent with Grade I diastolic dysfunction (impaired relaxation).  2. Right ventricular systolic function is normal. The right ventricular size is moderately enlarged.  3. The mitral valve is normal in structure. Mild mitral valve regurgitation.  4. The aortic valve is tricuspid. Aortic valve regurgitation is not visualized. Conclusion(s)/Recommendation(s): No intracardiac source of embolism detected on this transthoracic study. A transesophageal echocardiogram is recommended to exclude cardiac source of embolism if clinically indicated. FINDINGS  Left Ventricle: Left ventricular ejection fraction, by estimation, is 60 to 65%. The left ventricle has normal function. The left ventricle has no regional wall motion abnormalities. The left ventricular internal cavity size was normal in size. There is  mild concentric left ventricular hypertrophy. Left ventricular diastolic parameters are consistent with Grade I diastolic dysfunction (impaired relaxation). Right Ventricle: The right ventricular size is moderately enlarged. Right vetricular wall thickness was not well visualized. Right ventricular systolic function is normal. Left Atrium: Left atrial size was normal in size. Right Atrium: Right atrial size was normal in size. Pericardium: There is no evidence of pericardial effusion. Mitral Valve: The mitral valve is normal in structure. There is mild thickening of the mitral valve leaflet(s). Mild mitral annular calcification. Mild mitral valve regurgitation. Tricuspid Valve: The tricuspid valve is normal in structure. Tricuspid valve regurgitation is trivial. Aortic Valve: The aortic  valve is tricuspid. Aortic valve regurgitation is not visualized. Pulmonic Valve: The pulmonic valve was normal in structure. Pulmonic valve regurgitation is not visualized. Aorta: The aortic root is normal in size and structure. Venous: IVC assessment for right atrial pressure unable to be performed due to mechanical ventilation. IAS/Shunts: No atrial level shunt detected by color flow Doppler.  LEFT VENTRICLE PLAX 2D LVIDd:         4.20 cm  Diastology LVIDs:         2.60 cm  LV e' medial:    6.85 cm/s LV PW:         0.90 cm  LV E/e' medial:  13.8 LV IVS:        1.20 cm  LV e' lateral:   9.46 cm/s LVOT diam:     2.10 cm  LV E/e' lateral: 10.0 LVOT Area:     3.46 cm  RIGHT VENTRICLE RV S prime:     13.40 cm/s TAPSE (M-mode): 2.6 cm LEFT ATRIUM  Index       RIGHT ATRIUM          Index LA diam:        3.50 cm 1.67 cm/m  RA Area:     9.51 cm LA Vol (A2C):   37.2 ml 17.79 ml/m RA Volume:   20.40 ml 9.76 ml/m LA Vol (A4C):   32.9 ml 15.73 ml/m LA Biplane Vol: 34.8 ml 16.64 ml/m   AORTA Ao Root diam: 2.90 cm MITRAL VALVE MV Area (PHT): 3.16 cm    SHUNTS MV Decel Time: 240 msec    Systemic Diam: 2.10 cm MV E velocity: 94.70 cm/s MV A velocity: 97.30 cm/s MV E/A ratio:  0.97 Gwyndolyn Kaufman MD Electronically signed by Gwyndolyn Kaufman MD Signature Date/Time: 07/10/2020/1:22:51 PM    Final         Scheduled Meds: . amitriptyline  50 mg Oral QHS  . aspirin  325 mg Oral Daily  . chlorhexidine gluconate (MEDLINE KIT)  15 mL Mouth Rinse BID  . Chlorhexidine Gluconate Cloth  6 each Topical Q0600  . docusate sodium  100 mg Oral BID  . enoxaparin (LOVENOX) injection  40 mg Subcutaneous Q24H  . gabapentin  400 mg Oral Q6H  . ipratropium-albuterol  3 mL Nebulization BID  . pantoprazole  40 mg Oral Daily  . polyethylene glycol  17 g Oral Daily  . simvastatin  20 mg Oral q1800   Continuous Infusions: . azithromycin 500 mg (07/12/20 0234)  . ceFEPime (MAXIPIME) IV 2 g (07/12/20 0625)      LOS: 3 days    Time spent: 35 minutes    Adelma Bowdoin A Meztli Llanas, MD [facility]   If 7PM-7AM, please contact night-coverage [email or web address]  07/12/2020, 8:12 AM

## 2020-07-12 NOTE — Progress Notes (Signed)
SATURATION QUALIFICATIONS: (This note is used to comply with regulatory documentation for home oxygen)  Patient Saturations on Room Air at Rest = 94%  Patient Saturations on Room Air while Ambulating = 86%  Patient Saturations on 2 Liters of oxygen while Ambulating = 90%  Please briefly explain why patient needs home oxygen:Pt with desaturation on RA with activity and required 2L to maintain >88% with activity  Merryl Hacker, PT Acute Rehabilitation Services Pager: [phone] Office: [phone]

## 2020-07-13 LAB — BASIC METABOLIC PANEL
Anion gap: 10 (ref 5–15)
BUN: 14 mg/dL (ref 8–23)
CO2: 28 mmol/L (ref 22–32)
Calcium: 9 mg/dL (ref 8.9–10.3)
Chloride: 99 mmol/L (ref 98–111)
Creatinine, Ser: 0.74 mg/dL (ref 0.61–1.24)
GFR, Estimated: >60 mL/min (ref >60)
Glucose, Bld: 93 mg/dL (ref 70–99)
Potassium: 4 mmol/L (ref 3.5–5.1)
Sodium: 137 mmol/L (ref 135–145)

## 2020-07-13 MED ORDER — SIMVASTATIN 20 MG PO TABS: 20.0000 mg | ORAL_TABLET | Freq: Every day | ORAL | 1 refills | Status: DC

## 2020-07-13 MED ORDER — DOCUSATE SODIUM 100 MG PO CAPS: 100.0000 mg | ORAL_CAPSULE | Freq: Two times a day (BID) | ORAL | 0 refills | Status: DC

## 2020-07-13 MED ORDER — AZITHROMYCIN 500 MG PO TABS: 500.0000 mg | ORAL_TABLET | Freq: Every day | ORAL | 0 refills | Status: DC

## 2020-07-13 MED ORDER — CEPHALEXIN 500 MG PO CAPS: 500.0000 mg | ORAL_CAPSULE | Freq: Three times a day (TID) | ORAL | 0 refills | Status: AC

## 2020-07-13 MED ORDER — ASPIRIN EC 81 MG PO TBEC: 81.0000 mg | DELAYED_RELEASE_TABLET | Freq: Every day | ORAL | 2 refills | Status: AC

## 2020-07-13 NOTE — Progress Notes (Signed)
Occupational Therapy Treatment Patient Details Name: Kyle Payne MRN: [id number] DOB: 1940/10/31 Today's Date: 07/13/2020    History of present illness Pt is a 79 y/o male who presents with sudden onset LE weakness - first L LE and then R LE. He had left facial droop and mildly slurred speech. He was intubated for airway protection 12/17, extubated 12/18. Brain MRI negative for acute changes. CXR 12/18 concerning for BLL infitrates. PMH significant for COPD, rotator cuff repair, inguinal hernia repair.   OT comments  Pt making steady progress towards OT goals this session. Pt ambulating to BR with RN upon arrival, assisted pt with toileting with pt needing MIN guard assist for functional mobility with no AD mostly for line mgmt. Pt able to complete hygiene tasks and clothing mgmt during toileting with up to min guard assist. Pt completed functional mobility greater than a household distance with no AD and min guard assist for line mgmt. Pt on 2L during mobility with SpO2 >/= 88% max HR 105 bpm. Pt anxious to return home potentially today with pts sister. Pt would continue to benefit from skilled occupational therapy while admitted and after d/c to address the below listed limitations in order to improve overall functional mobility and facilitate independence with BADL participation. DC plan remains appropriate, will follow acutely per POC.     Follow Up Recommendations  Home health OT;Supervision/Assistance - 24 hour    Equipment Recommendations  None recommended by OT;Other (comment) (pt's DME needs are met)    Recommendations for Other Services      Precautions / Restrictions Precautions Precautions: Other (comment) Precaution Comments: watch O2 Restrictions Weight Bearing Restrictions: No       Mobility Bed Mobility               General bed mobility comments: pt OOB upon arrival going to bathroom with RN  Transfers Overall transfer level: Needs assistance Equipment  used: None Transfers: Sit to/from Stand Sit to Stand: Min guard;Supervision         General transfer comment: for line mgmt and cues for handplacement    Balance Overall balance assessment: Needs assistance Sitting-balance support: Feet supported;No upper extremity supported Sitting balance-Leahy Scale: Good     Standing balance support: No upper extremity supported;During functional activity Standing balance-Leahy Scale: Fair Standing balance comment: No UE support required during grooming tasks                           ADL either performed or assessed with clinical judgement   ADL Overall ADL's : Needs assistance/impaired     Grooming: Wash/dry face;Standing;Supervision/safety;Min guard Grooming Details (indicate cue type and reason): gross supervision - min guard for line mgmt             Lower Body Dressing: Sit to/from stand;Supervision/safety Lower Body Dressing Details (indicate cue type and reason): to do<>doff pants for toileting Toilet Transfer: Min Marine scientist Details (indicate cue type and reason): min guard for safety and line mgmt Toileting- Water quality scientist and Hygiene: Min guard;Sit to/from stand Toileting - Clothing Manipulation Details (indicate cue type and reason): anterior/ psoterior Advertising account planner Details (indicate cue type and reason): pt reports walk in shower at home Functional mobility during ADLs: Min guard;Rolling walker General ADL Comments: pt making great progress towards OT goals able to complete toileting, UB grooming and LB ADLs with up to min guard mostly for safety and line mgmt with RW.  pt hopeful to DC home with sister. pt on 2L during session with SpO2 >/= 88% HR max 104 bpm     Vision       Perception     Praxis      Cognition Arousal/Alertness: Awake/alert Behavior During Therapy: WFL for tasks assessed/performed Overall Cognitive Status: Within Functional Limits  for tasks assessed                                          Exercises     Shoulder Instructions       General Comments pt in 2L during session with SpO2 >/= 88% during mobility, HR max 104 bpm    Pertinent Vitals/ Pain       Pain Assessment: No/denies pain  Home Living                                          Prior Functioning/Environment              Frequency  Min 2X/week        Progress Toward Goals  OT Goals(current goals can now be found in the care plan section)  Progress towards OT goals: Progressing toward goals  Acute Rehab OT Goals Patient Stated Goal: Return home ASAP OT Goal Formulation: With patient Time For Goal Achievement: 07/25/20 Potential to Achieve Goals: Good  Plan Discharge plan remains appropriate;Frequency remains appropriate    Co-evaluation                 AM-PAC OT "6 Clicks" Daily Activity     Outcome Measure   Help from another person eating meals?: None Help from another person taking care of personal grooming?: A Little Help from another person toileting, which includes using toliet, bedpan, or urinal?: A Little Help from another person bathing (including washing, rinsing, drying)?: A Little Help from another person to put on and taking off regular upper body clothing?: None Help from another person to put on and taking off regular lower body clothing?: A Little 6 Click Score: 20    End of Session Equipment Utilized During Treatment: Gait belt;Oxygen;Other (comment) (2L)  OT Visit Diagnosis: Muscle weakness (generalized) (M62.81);Unsteadiness on feet (R26.81);Other symptoms and signs involving the nervous system (R29.898)   Activity Tolerance Patient tolerated treatment well   Patient Left in chair;with call bell/phone within reach;with chair alarm set   Nurse Communication Mobility status;Other (comment) (needs new bubbler for HFNC; left pt on 2L on tank)        Time:  8099-8338 OT Time Calculation (min): 20 min  Charges: OT General Charges $OT Visit: 1 Visit OT Treatments $Self Care/Home Management : 8-22 mins  Lanier Clam., COTA/L Acute Rehabilitation Services [phone] [place] 07/13/2020, 9:11 AM

## 2020-07-13 NOTE — Progress Notes (Signed)
Deion N Smarr to be D/C'd Home per MD order.  Discussed with the patient and all questions fully answered.  VSS, Skin clean, dry and intact without evidence of skin break down, no evidence of skin tears noted. IV catheter discontinued intact. Site without signs and symptoms of complications. Dressing and pressure applied.  An After Visit Summary was printed and given to the patient. Patient received prescription.  D/c education completed with patient/family including follow up instructions, medication list, d/c activities limitations if indicated, with other d/c instructions as indicated by MD - patient able to verbalize understanding, all questions fully answered.   Patient instructed to return to ED, call 911, or call MD for any changes in condition.   Patient escorted via WC, and D/C home via private auto.  Oxygen deliver to the room and at home.   Conley Canal [facility] 07/13/2020 12:53 PM

## 2020-07-13 NOTE — Discharge Summary (Signed)
Physician Discharge Summary  Kyle Payne:[id number] DOB: 1940/08/09 DOA: 07/09/2020  PCP: Kyle Bass, MD  Admit date: 07/09/2020 Discharge date: 07/13/2020  Admitted From: Home  Disposition:  Home   Recommendations for Outpatient Follow-up:  1. Follow up with PCP in 1-2 weeks 2. Please obtain BMP/CBC in one week 3. Needs follow up for resolution of PNA.   Home Health: none   Discharge Condition: Stable.  CODE STATUS: Full code Diet recommendation: Heart Healthy   Brief/Interim Summary: 79 year old with past medical history significant for obesity, OSA not on CPAP, COPD, hyperlipidemia, who presented with sudden onset of leg weakness, for his left leg and then his right leg.  History was obtained from sister who was at bedside.  Patient had left facial droop and mildly slurred speech which per sister slurred speech is his baseline.  He had weakness in whole extremity upon presentation and progressive encephalopathy.  Patient developed somnolence and snoring with likely obstructive apnea when he laid flat in the ED.  Patient was intubated for airway protection.  CT head was negative for CVA.  He had transient hypotension due to sedation responsive to short-term pressors and IV fluids.  Patient has not been sick recently.  No new medication.  Intubated on admission 12/17.  Neurology was consulted.  Patient was able to be extubated on 12/18.  Transfer to hospitalist care 12/19.   1-Acute Respiratory Failure with Hypoxia and Hypercapnia: secondary to PNA Ph 7.2, PCO2; 60 Precipitated by mental status changes or bibasilar pneumonia versus aspiration. Continue with nebulizer. Continue with cefepime and azithromycin for pneumonia for 5 days. Urine Legionella antigen:Negative Urinary pneumococcal antigen negative Tracheal culture; Few normal respiratory flora.  He will need home oxygen. 3 L He is stable, chest x ray from yesterday had stable infiltrates.  discharge on keflex  for 3 more days, Zithromax for 2 days.   2-Acute Metabolic encephalopathy: secondary to hypercapnia versus sepsis acute diffuse weakness: Neurology following. CT head; artifact in the posterior fossa.  Cerebellar and lower occipital infarction cannot be excluded.  No acute finding. CTA head and neck: Normal.  Negative perfusion study. MRI Brain: No acute or reversible finding.  Mild chronic small vessel ischemic changes.  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  LDL 133, HbA1c 5.8, ECHO normal EF, no thrombus, .  Resolved.   3-Sepsis; PNA;  Chest x ray ; Bilateral infiltrates.  Patient presents with encephalopathy, hypothermia T 94, Tachypnea RR 25, subsequently tachycardic.  Tracheal culture; growing gram-positive cocci and rare gram-negative rods. Continue with cefepime and azithromycin. Chest x ray repeated, negatieve for pleural effusion.   Hypoglycemia: Resolved  4-elevated TSH:5.8 Free T 4 normal.   OSA not on CPAP, COPD: Continue with nebulizer twice daily.  Hypotension: He was transiently on Levophed.  This was thought to be related to sedation.  Resolved  Neuropathy: resume  home dose gabapentin,  resume amitriptyline. Hypokalemia: resolved.     Discharge Diagnoses:  Active Problems:   Acute respiratory failure with hypoxia and hypercapnia (HCC)   Altered mental status    Discharge Instructions  Discharge Instructions    Diet - low sodium heart healthy   Complete by: As directed    Increase activity slowly   Complete by: As directed      Allergies as of 07/13/2020      Reactions   Niacin And Related Other (See Comments)   Unknown Reaction      Medication List    STOP taking these medications  ibuprofen 200 MG tablet Commonly known as: ADVIL     TAKE these medications   amitriptyline 75 MG tablet Commonly known as: ELAVIL Take 75 mg by mouth at bedtime.   aspirin EC 81 MG tablet Take 1 tablet (81 mg total) by mouth daily. Swallow  whole.   azithromycin 500 MG tablet Commonly known as: ZITHROMAX Take 1 tablet (500 mg total) by mouth daily.   Centrum Adults Tabs Take 1 tablet by mouth daily.   cephALEXin 500 MG capsule Commonly known as: KEFLEX Take 1 capsule (500 mg total) by mouth 3 (three) times daily for 3 days.   Cyanocobalamin 2000 MCG Tbcr Take 2,000 mcg by mouth daily.   docusate sodium 100 MG capsule Commonly known as: COLACE Take 1 capsule (100 mg total) by mouth 2 (two) times daily.   gabapentin 800 MG tablet Commonly known as: NEURONTIN Take 800 mg by mouth 4 (four) times daily.   omeprazole 20 MG capsule Commonly known as: PRILOSEC Take 20 mg by mouth daily.   simvastatin 20 MG tablet Commonly known as: ZOCOR Take 1 tablet (20 mg total) by mouth daily at 6 PM.            Durable Medical Equipment  (From admission, onward)         Start     Ordered   07/12/20 1143  For home use only DME oxygen  Once       Question Answer Comment  Length of Need 6 Months   Mode or (Route) Nasal cannula   Liters per Minute 3   Frequency Continuous (stationary and portable oxygen unit needed)   Oxygen delivery system Gas      07/12/20 1142          Follow-up Information    [facility] Call.   Why: for questions about home oxygen Contact information: [address] [place] [address] [phone]        Kyle Bass, MD Follow up in 1 week(s).   Specialty: Family Medicine Contact information: [address] [place] [address] [phone]              Allergies  Allergen Reactions  . Niacin And Related Other (See Comments)    Unknown Reaction    Consultations:  CCM admitted patient.    Procedures/Studies: MR BRAIN WO CONTRAST  Result Date: 07/10/2020 CLINICAL DATA:  Sudden onset of left leg more than right leg weakness. EXAM: MRI HEAD WITHOUT CONTRAST TECHNIQUE: Multiplanar, multiecho pulse sequences of the brain and surrounding  structures were obtained without intravenous contrast. COMPARISON:  CT studies done yesterday. FINDINGS: Brain: Diffusion imaging does not show any acute or subacute infarction. The brainstem and cerebellum are normal. Old small vessel infarction in the left thalamus. Mild chronic small-vessel ischemic changes affect the cerebral hemispheric white matter. No cortical or large vessel territory infarction. No mass lesion, hemorrhage, hydrocephalus or extra-axial collection. Vascular: Major vessels at the base of the brain show flow. Skull and upper cervical spine: Negative Sinuses/Orbits: Clear/normal Other: None IMPRESSION: No acute or reversible finding. No specific cause of the presenting symptoms is identified. Mild chronic small-vessel ischemic changes throughout the brain as outlined above. 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 Electronically Signed   By: Paulina Fusi M.D.   On: 07/10/2020 15:27   CT CEREBRAL PERFUSION W CONTRAST  Result Date: 07/09/2020 CLINICAL DATA:  Left-sided weakness EXAM: CT ANGIOGRAPHY HEAD AND NECK CT PERFUSION BRAIN TECHNIQUE: Multidetector CT imaging of  the head and neck was performed using the standard protocol during bolus administration of intravenous contrast. Multiplanar CT image reconstructions and MIPs were obtained to evaluate the vascular anatomy. Carotid stenosis measurements (when applicable) are obtained utilizing NASCET criteria, using the distal internal carotid diameter as the denominator. Multiphase CT imaging of the brain was performed following IV bolus contrast injection. Subsequent parametric perfusion maps were calculated using RAPID software. CONTRAST:  OMNIPAQUE IOHEXOL 350 MG/ML SOLN COMPARISON:  Head CT same day FINDINGS: CTA NECK FINDINGS Aortic arch: Aortic atherosclerosis. Branching pattern is normal without origin stenosis. Right carotid system: Normal common carotid artery and bifurcation. Cervical ICA is tortuous but widely patent. Left  carotid system: Normal Vertebral arteries: Vertebral artery origins widely patent. Both vertebral arteries appear normal through the cervical region to the foramen magnum. Skeleton: Ordinary cervical spondylosis. Other neck: No mass or lymphadenopathy. Upper chest: Emphysema and pulmonary scarring. Review of the MIP images confirms the above findings CTA HEAD FINDINGS Anterior circulation: Both internal carotid arteries widely patent through the skull base and siphon regions. The anterior and middle cerebral vessels are normal. No large or medium vessel occlusion. Posterior circulation: Both vertebral arteries widely patent to the basilar. No basilar stenosis. Posterior circulation branch vessels appear normal. Venous sinuses: Patent and normal. Anatomic variants: None significant. Review of the MIP images confirms the above findings CT Brain Perfusion Findings: ASPECTS: 10 CBF (<30%) Volume: 3mL Perfusion (Tmax>6.0s) volume: 79mL Mismatch Volume: 40mL Infarction Location:None IMPRESSION: 1. Normal CT angiography of the head and neck vessels. 2. Negative perfusion study. 3. Emphysema and aortic atherosclerosis. Aortic Atherosclerosis (ICD10-I70.0) and Emphysema (ICD10-J43.9). Electronically Signed   By: Paulina Fusi M.D.   On: 07/09/2020 20:40   DG CHEST PORT 1 VIEW  Result Date: 07/12/2020 CLINICAL DATA:  Pneumonia EXAM: PORTABLE CHEST 1 VIEW COMPARISON:  July 10, 2020 FINDINGS: Endotracheal tube and nasogastric tube have been removed. No pneumothorax. There is patchy airspace opacity in each lung base. There is stable apical pleural thickening bilaterally. Lungs elsewhere are clear. Heart size is normal with pulmonary vascularity normal. No adenopathy. No bone lesions. IMPRESSION: Patchy bibasilar opacity, concerning for atelectasis with superimposed pneumonia. Lungs elsewhere clear except for stable apical pleural thickening. Stable cardiac silhouette. No pneumothorax. Electronically Signed   By: Bretta Bang III M.D.   On: 07/12/2020 09:09   DG Chest Port 1 View  Result Date: 07/10/2020 CLINICAL DATA:  Acute respiratory failure. Acute respiratory failure. Endotracheally intubated. Orogastric tube placement. EXAM: PORTABLE CHEST 1 VIEW COMPARISON:  07/09/2020 FINDINGS: Endotracheal tube remains in appropriate position. A new nasogastric tube is seen entering the stomach. Heart size remains within normal limits. Bibasilar pulmonary infiltrates show no significant change. No new or worsening areas of pulmonary infiltrate are seen. Mild bibasilar pleural thickening noted, without definite effusion. IMPRESSION: New nasogastric tube in seen entering the stomach. No significant change in bibasilar pulmonary infiltrates. Electronically Signed   By: Danae Orleans M.D.   On: 07/10/2020 08:35   DG Chest Portable 1 View  Result Date: 07/09/2020 CLINICAL DATA:  Status post intubation. EXAM: PORTABLE CHEST 1 VIEW COMPARISON:  May 06, 2015 FINDINGS: An endotracheal tube is seen with its distal tip approximately 3.3 cm from the carina. The lungs are hyperinflated. Mild atelectasis and/or infiltrate is seen within the bilateral lung bases, right greater than left. There is no evidence of a pleural effusion or pneumothorax. The heart size and mediastinal contours are within normal limits. The visualized skeletal structures are unremarkable.  IMPRESSION: 1. Endotracheal tube positioning, as described above. 2. Mild bibasilar atelectasis and/or infiltrate, right greater than left. Electronically Signed   By: Aram Candela M.D.   On: 07/09/2020 20:08   DG Abd Portable 1V  Result Date: 07/10/2020 CLINICAL DATA:  Evaluate OG tube EXAM: PORTABLE ABDOMEN - 1 VIEW COMPARISON:  None. FINDINGS: The OG tube terminates in the stomach. IMPRESSION: The OG tube terminates in the stomach. Electronically Signed   By: Gerome Sam III M.D   On: 07/10/2020 09:50   DG Abd Portable 1V  Result Date: 07/09/2020 CLINICAL  DATA:  OG tube placement EXAM: PORTABLE ABDOMEN - 1 VIEW COMPARISON:  07/09/2020 FINDINGS: Endotracheal tube tip is about 4.2 cm superior to the carina. Esophageal tube side-port projects over the distal esophagus. Small bilateral pleural effusions with left greater than right basilar airspace disease. Stable cardiomediastinal silhouette. No pneumothorax. Excreted contrast within the renal collecting systems. IMPRESSION: 1. Endotracheal tube tip about 4.2 cm superior to the carina. 2. Esophageal side port projects over distal esophagus, recommend further advancement by at least 10 cm for more optimal positioning. 3. Small bilateral pleural effusions and bibasilar airspace disease. Electronically Signed   By: Jasmine Pang M.D.   On: 07/09/2020 22:06   ECHOCARDIOGRAM COMPLETE  Result Date: 07/10/2020    ECHOCARDIOGRAM REPORT   Patient Name:   TIGE MEAS Date of Exam: 07/10/2020 Medical Rec #:  [id number]     Height:       72.0 in Accession #:    [id number]    Weight:       191.4 lb Date of Birth:  Nov 24, 1940     BSA:          2.091 m Patient Age:    79 years      BP:           112/84 mmHg Patient Gender: M             HR:           76 bpm. Exam Location:  Inpatient Procedure: 2D Echo Indications:    TIA 435.9 / G45.9  History:        Patient has no prior history of Echocardiogram examinations.                 Signs/Symptoms:Syncope; Risk Factors:Dyslipidemia and Former                 Smoker. Acute respiratory failure with hypoxia and hypercapnia.  Sonographer:    Jeryl Columbia Referring Phys: [id number] Kyle Payne IMPRESSIONS  1. Left ventricular ejection fraction, by estimation, is 60 to 65%. The left ventricle has normal function. The left ventricle has no regional wall motion abnormalities. There is mild concentric left ventricular hypertrophy. Left ventricular diastolic parameters are consistent with Grade I diastolic dysfunction (impaired relaxation).  2. Right ventricular systolic function is normal.  The right ventricular size is moderately enlarged.  3. The mitral valve is normal in structure. Mild mitral valve regurgitation.  4. The aortic valve is tricuspid. Aortic valve regurgitation is not visualized. Conclusion(s)/Recommendation(s): No intracardiac source of embolism detected on this transthoracic study. A transesophageal echocardiogram is recommended to exclude cardiac source of embolism if clinically indicated. FINDINGS  Left Ventricle: Left ventricular ejection fraction, by estimation, is 60 to 65%. The left ventricle has normal function. The left ventricle has no regional wall motion abnormalities. The left ventricular internal cavity size was normal in size. There is  mild concentric left ventricular  hypertrophy. Left ventricular diastolic parameters are consistent with Grade I diastolic dysfunction (impaired relaxation). Right Ventricle: The right ventricular size is moderately enlarged. Right vetricular wall thickness was not well visualized. Right ventricular systolic function is normal. Left Atrium: Left atrial size was normal in size. Right Atrium: Right atrial size was normal in size. Pericardium: There is no evidence of pericardial effusion. Mitral Valve: The mitral valve is normal in structure. There is mild thickening of the mitral valve leaflet(s). Mild mitral annular calcification. Mild mitral valve regurgitation. Tricuspid Valve: The tricuspid valve is normal in structure. Tricuspid valve regurgitation is trivial. Aortic Valve: The aortic valve is tricuspid. Aortic valve regurgitation is not visualized. Pulmonic Valve: The pulmonic valve was normal in structure. Pulmonic valve regurgitation is not visualized. Aorta: The aortic root is normal in size and structure. Venous: IVC assessment for right atrial pressure unable to be performed due to mechanical ventilation. IAS/Shunts: No atrial level shunt detected by color flow Doppler.  LEFT VENTRICLE PLAX 2D LVIDd:         4.20 cm  Diastology  LVIDs:         2.60 cm  LV e' medial:    6.85 cm/s LV PW:         0.90 cm  LV E/e' medial:  13.8 LV IVS:        1.20 cm  LV e' lateral:   9.46 cm/s LVOT diam:     2.10 cm  LV E/e' lateral: 10.0 LVOT Area:     3.46 cm  RIGHT VENTRICLE RV S prime:     13.40 cm/s TAPSE (M-mode): 2.6 cm LEFT ATRIUM             Index       RIGHT ATRIUM          Index LA diam:        3.50 cm 1.67 cm/m  RA Area:     9.51 cm LA Vol (A2C):   37.2 ml 17.79 ml/m RA Volume:   20.40 ml 9.76 ml/m LA Vol (A4C):   32.9 ml 15.73 ml/m LA Biplane Vol: 34.8 ml 16.64 ml/m   AORTA Ao Root diam: 2.90 cm MITRAL VALVE MV Area (PHT): 3.16 cm    SHUNTS MV Decel Time: 240 msec    Systemic Diam: 2.10 cm MV E velocity: 94.70 cm/s MV A velocity: 97.30 cm/s MV E/A ratio:  0.97 Laurance Flatten MD Electronically signed by Laurance Flatten MD Signature Date/Time: 07/10/2020/1:22:51 PM    Final    CT HEAD CODE STROKE WO CONTRAST  Result Date: 07/09/2020 CLINICAL DATA:  Code stroke.  Left-sided weakness. EXAM: CT HEAD WITHOUT CONTRAST TECHNIQUE: Contiguous axial images were obtained from the base of the skull through the vertex without intravenous contrast. COMPARISON:  10/15/2017 FINDINGS: Brain: There is some sort a of artifact in the posterior fossa. Cerebellar and lower occipital infarction can not be excluded. Otherwise, cerebral hemispheres do not show any evidence of acute infarction. Mild age related volume loss. No hemorrhage, hydrocephalus or extra-axial collection. Vascular: There is atherosclerotic calcification of the major vessels at the base of the brain. Skull: Chronic lucent area in the right frontal region, consistent with a benign finding. Sinuses/Orbits: Clear/normal Other: None ASPECTS (Alberta Stroke Program Early CT Score) - Ganglionic level infarction (caudate, lentiform nuclei, internal capsule, insula, M1-M3 cortex): 7 - Supraganglionic infarction (M4-M6 cortex): 3 Total score (0-10 with 10 being normal): 10 IMPRESSION: 1. Some  sort of artifact in the posterior fossa. Cerebellar  and lower occipital infarction can not be excluded. Otherwise, no acute finding. 2. ASPECTS is 10. 3. These results were communicated to Dr. Otelia Limes at 8:10 pmon 12/17/2021by text page via the [facility] messaging system. Electronically Signed   By: Paulina Fusi M.D.   On: 07/09/2020 20:12   CT ANGIO HEAD CODE STROKE  Result Date: 07/09/2020 CLINICAL DATA:  Left-sided weakness EXAM: CT ANGIOGRAPHY HEAD AND NECK CT PERFUSION BRAIN TECHNIQUE: Multidetector CT imaging of the head and neck was performed using the standard protocol during bolus administration of intravenous contrast. Multiplanar CT image reconstructions and MIPs were obtained to evaluate the vascular anatomy. Carotid stenosis measurements (when applicable) are obtained utilizing NASCET criteria, using the distal internal carotid diameter as the denominator. Multiphase CT imaging of the brain was performed following IV bolus contrast injection. Subsequent parametric perfusion maps were calculated using RAPID software. CONTRAST:  OMNIPAQUE IOHEXOL 350 MG/ML SOLN COMPARISON:  Head CT same day FINDINGS: CTA NECK FINDINGS Aortic arch: Aortic atherosclerosis. Branching pattern is normal without origin stenosis. Right carotid system: Normal common carotid artery and bifurcation. Cervical ICA is tortuous but widely patent. Left carotid system: Normal Vertebral arteries: Vertebral artery origins widely patent. Both vertebral arteries appear normal through the cervical region to the foramen magnum. Skeleton: Ordinary cervical spondylosis. Other neck: No mass or lymphadenopathy. Upper chest: Emphysema and pulmonary scarring. Review of the MIP images confirms the above findings CTA HEAD FINDINGS Anterior circulation: Both internal carotid arteries widely patent through the skull base and siphon regions. The anterior and middle cerebral vessels are normal. No large or medium vessel occlusion. Posterior  circulation: Both vertebral arteries widely patent to the basilar. No basilar stenosis. Posterior circulation branch vessels appear normal. Venous sinuses: Patent and normal. Anatomic variants: None significant. Review of the MIP images confirms the above findings CT Brain Perfusion Findings: ASPECTS: 10 CBF (<30%) Volume: 0mL Perfusion (Tmax>6.0s) volume: 0mL Mismatch Volume: 0mL Infarction Location:None IMPRESSION: 1. Normal CT angiography of the head and neck vessels. 2. Negative perfusion study. 3. Emphysema and aortic atherosclerosis. Aortic Atherosclerosis (ICD10-I70.0) and Emphysema (ICD10-J43.9). Electronically Signed   By: Paulina Fusi M.D.   On: 07/09/2020 20:40   CT ANGIO NECK CODE STROKE  Result Date: 07/09/2020 CLINICAL DATA:  Left-sided weakness EXAM: CT ANGIOGRAPHY HEAD AND NECK CT PERFUSION BRAIN TECHNIQUE: Multidetector CT imaging of the head and neck was performed using the standard protocol during bolus administration of intravenous contrast. Multiplanar CT image reconstructions and MIPs were obtained to evaluate the vascular anatomy. Carotid stenosis measurements (when applicable) are obtained utilizing NASCET criteria, using the distal internal carotid diameter as the denominator. Multiphase CT imaging of the brain was performed following IV bolus contrast injection. Subsequent parametric perfusion maps were calculated using RAPID software. CONTRAST:  OMNIPAQUE IOHEXOL 350 MG/ML SOLN COMPARISON:  Head CT same day FINDINGS: CTA NECK FINDINGS Aortic arch: Aortic atherosclerosis. Branching pattern is normal without origin stenosis. Right carotid system: Normal common carotid artery and bifurcation. Cervical ICA is tortuous but widely patent. Left carotid system: Normal Vertebral arteries: Vertebral artery origins widely patent. Both vertebral arteries appear normal through the cervical region to the foramen magnum. Skeleton: Ordinary cervical spondylosis. Other neck: No mass or  lymphadenopathy. Upper chest: Emphysema and pulmonary scarring. Review of the MIP images confirms the above findings CTA HEAD FINDINGS Anterior circulation: Both internal carotid arteries widely patent through the skull base and siphon regions. The anterior and middle cerebral vessels are normal. No large or medium vessel occlusion. Posterior  circulation: Both vertebral arteries widely patent to the basilar. No basilar stenosis. Posterior circulation branch vessels appear normal. Venous sinuses: Patent and normal. Anatomic variants: None significant. Review of the MIP images confirms the above findings CT Brain Perfusion Findings: ASPECTS: 10 CBF (<30%) Volume: 0mL Perfusion (Tmax>6.0s) volume: 0mL Mismatch Volume: 0mL Infarction Location:None IMPRESSION: 1. Normal CT angiography of the head and neck vessels. 2. Negative perfusion study. 3. Emphysema and aortic atherosclerosis. Aortic Atherosclerosis (ICD10-I70.0) and Emphysema (ICD10-J43.9). Electronically Signed   By: Paulina Fusi M.D.   On: 07/09/2020 20:40     Subjective: He is feeling better. Denies worsening cough. Dyspnea improved  Discharge Exam: Vitals:   07/13/20 0800 07/13/20 0900  BP: 120/71 136/75  Pulse: 87 84  Resp: 16 14  Temp:    SpO2: 92% 95%     General: Pt is alert, awake, not in acute distress Cardiovascular: RRR, S1/S2 +, no rubs, no gallops Respiratory: CTA bilaterally, no wheezing, no rhonchi Abdominal: Soft, NT, ND, bowel sounds + Extremities: no edema, no cyanosis    The results of significant diagnostics from this hospitalization (including imaging, microbiology, ancillary and laboratory) are listed below for reference.     Microbiology: Recent Results (from the past 240 hour(s))  Resp Panel by RT-PCR (Flu A&B, Covid) Nasopharyngeal Swab     Status: None   Collection Time: 07/09/20  7:34 PM   Specimen: Nasopharyngeal Swab; Nasopharyngeal(NP) swabs in vial transport medium  Result Value Ref Range Status    SARS Coronavirus 2 by RT PCR NEGATIVE NEGATIVE Final    Comment: (NOTE) SARS-CoV-2 target nucleic acids are NOT DETECTED.  The SARS-CoV-2 RNA is generally detectable in upper respiratory specimens during the acute phase of infection. The lowest concentration of SARS-CoV-2 viral copies this assay can detect is 138 copies/mL. A negative result does not preclude SARS-Cov-2 infection and should not be used as the sole basis for treatment or other patient management decisions. A negative result may occur with  improper specimen collection/handling, submission of specimen other than nasopharyngeal swab, presence of viral mutation(s) within the areas targeted by this assay, and inadequate number of viral copies(<138 copies/mL). A negative result must be combined with clinical observations, patient history, and epidemiological information. The expected result is Negative.  Fact Sheet for Patients:  BloggerCourse.com  Fact Sheet for Healthcare Providers:  SeriousBroker.it  This test is no t yet approved or cleared by the [place] FDA and  has been authorized for detection and/or diagnosis of SARS-CoV-2 by FDA under an Emergency Use Authorization (EUA). This EUA will remain  in effect (meaning this test can be used) for the duration of the COVID-19 declaration under Section 564(b)(1) of the Act, 21 U.S.C.section 360bbb-3(b)(1), unless the authorization is terminated  or revoked sooner.       Influenza A by PCR NEGATIVE NEGATIVE Final   Influenza B by PCR NEGATIVE NEGATIVE Final    Comment: (NOTE) The Xpert Xpress SARS-CoV-2/FLU/RSV plus assay is intended as an aid in the diagnosis of influenza from Nasopharyngeal swab specimens and should not be used as a sole basis for treatment. Nasal washings and aspirates are unacceptable for Xpert Xpress SARS-CoV-2/FLU/RSV testing.  Fact Sheet for  Patients: BloggerCourse.com  Fact Sheet for Healthcare Providers: SeriousBroker.it  This test is not yet approved or cleared by the [place] FDA and has been authorized for detection and/or diagnosis of SARS-CoV-2 by FDA under an Emergency Use Authorization (EUA). This EUA will remain in effect (meaning this test can be  used) for the duration of the COVID-19 declaration under Section 564(b)(1) of the Act, 21 U.S.C. section 360bbb-3(b)(1), unless the authorization is terminated or revoked.  Performed at [facility], [address] [place], [place] [address]   Culture, blood (routine x 2)     Status: None (Preliminary result)   Collection Time: 07/09/20 11:35 PM   Specimen: BLOOD RIGHT HAND  Result Value Ref Range Status   Specimen Description BLOOD RIGHT HAND  Final   Special Requests   Final    BOTTLES DRAWN AEROBIC AND ANAEROBIC Blood Culture results may not be optimal due to an inadequate volume of blood received in culture bottles   Culture   Final    NO GROWTH 3 DAYS Performed at [facility], [address] [place], [place] [address]    Report Status PENDING  Incomplete  Culture, blood (routine x 2)     Status: None (Preliminary result)   Collection Time: 07/09/20 11:35 PM   Specimen: BLOOD LEFT HAND  Result Value Ref Range Status   Specimen Description BLOOD LEFT HAND  Final   Special Requests   Final    BOTTLES DRAWN AEROBIC AND ANAEROBIC Blood Culture results may not be optimal due to an inadequate volume of blood received in culture bottles   Culture   Final    NO GROWTH 3 DAYS Performed at [facility], [address] [place], [place] [address]    Report Status PENDING  Incomplete  Culture, respiratory (non-expectorated)     Status: None   Collection Time: 07/09/20 11:35 PM   Specimen: Tracheal Aspirate; Respiratory  Result Value Ref Range Status   Specimen Description TRACHEAL ASPIRATE   Final   Special Requests NONE  Final   Gram Stain   Final    RARE WBC PRESENT, PREDOMINANTLY PMN ABUNDANT GRAM POSITIVE COCCI RARE GRAM NEGATIVE RODS    Culture   Final    FEW Normal respiratory flora-no Staph aureus or Pseudomonas seen Performed at [facility], [address] [place], [place] [address]    Report Status 07/12/2020 FINAL  Final  MRSA PCR Screening     Status: None   Collection Time: 07/10/20 12:40 AM   Specimen: Nasopharyngeal  Result Value Ref Range Status   MRSA by PCR NEGATIVE NEGATIVE Final    Comment:        The GeneXpert MRSA Assay (FDA approved for NASAL specimens only), is one component of a comprehensive MRSA colonization surveillance program. It is not intended to diagnose MRSA infection nor to guide or monitor treatment for MRSA infections. Performed at [facility], [address] [place], [place] [address]      Labs: BNP (last 3 results) No results for input(s): BNP in the last 8760 hours. Basic Metabolic Panel: Recent Labs  Lab 07/09/20 1925 07/09/20 1928 07/09/20 2145 07/10/20 0119 07/10/20 0527 07/11/20 0322 07/12/20 0153 07/13/20 0616  NA 138 138   < > 138 139 139 139 137  K 3.8 3.9   < > 4.5 3.2* 3.7 3.3* 4.0  CL 99 100  --  105  --  105 103 99  CO2 27  --   --  23  --  25 28 28   GLUCOSE 97 94  --  91  --  106* 107* 93  BUN 19 23  --  17  --  15 11 14   CREATININE 1.09 1.00  --  1.00  --  0.69 0.73 0.74  CALCIUM 9.6  --   --  8.5*  --  8.8* 8.8* 9.0   < > = values in this interval not displayed.   Liver Function Tests: Recent Labs  Lab 07/09/20 1925  AST 22  ALT 21  ALKPHOS 49  BILITOT 0.6  PROT 7.1  ALBUMIN 4.1   No results for input(s): LIPASE, AMYLASE in the last 168 hours. No results for input(s): AMMONIA in the last 168 hours. CBC: Recent Labs  Lab 07/09/20 1925 07/09/20 1928 07/09/20 2145 07/10/20 0119 07/10/20 0527 07/11/20 0322 07/12/20 0153  WBC 5.8  --   --  7.0  --  8.1 7.4   NEUTROABS 3.2  --   --   --   --   --   --   HGB 14.2   < > 13.6 13.0 14.3 13.1 13.2  HCT 45.3   < > 40.0 42.0 42.0 41.2 41.4  MCV 94.8  --   --  96.1  --  93.8 93.9  PLT 193  --   --  161  --  180 181   < > = values in this interval not displayed.   Cardiac Enzymes: No results for input(s): CKTOTAL, CKMB, CKMBINDEX, TROPONINI in the last 168 hours. BNP: Invalid input(s): POCBNP CBG: Recent Labs  Lab 07/10/20 0031 07/10/20 0152 07/10/20 0326 07/10/20 0732 07/10/20 0858  GLUCAP 77 79 84 69* 115*   D-Dimer No results for input(s): DDIMER in the last 72 hours. Hgb A1c Recent Labs    07/11/20 0322  HGBA1C 5.8*   Lipid Profile Recent Labs    07/11/20 0322  CHOL 182  HDL 38*  LDLCALC 133*  TRIG 53  54  CHOLHDL 4.8   Thyroid function studies No results for input(s): TSH, T4TOTAL, T3FREE, THYROIDAB in the last 72 hours.  Invalid input(s): FREET3 Anemia work up No results for input(s): VITAMINB12, FOLATE, FERRITIN, TIBC, IRON, RETICCTPCT in the last 72 hours. Urinalysis    Component Value Date/Time   COLORURINE YELLOW 07/09/2020 2146   APPEARANCEUR CLEAR 07/09/2020 2146   LABSPEC >1.046 (H) 07/09/2020 2146   PHURINE 5.0 07/09/2020 2146   GLUCOSEU NEGATIVE 07/09/2020 2146   HGBUR NEGATIVE 07/09/2020 2146   BILIRUBINUR NEGATIVE 07/09/2020 2146   KETONESUR NEGATIVE 07/09/2020 2146   PROTEINUR NEGATIVE 07/09/2020 2146   NITRITE NEGATIVE 07/09/2020 2146   LEUKOCYTESUR NEGATIVE 07/09/2020 2146   Sepsis Labs Invalid input(s): PROCALCITONIN,  WBC,  LACTICIDVEN Microbiology Recent Results (from the past 240 hour(s))  Resp Panel by RT-PCR (Flu A&B, Covid) Nasopharyngeal Swab     Status: None   Collection Time: 07/09/20  7:34 PM   Specimen: Nasopharyngeal Swab; Nasopharyngeal(NP) swabs in vial transport medium  Result Value Ref Range Status   SARS Coronavirus 2 by RT PCR NEGATIVE NEGATIVE Final    Comment: (NOTE) SARS-CoV-2 target nucleic acids are NOT  DETECTED.  The SARS-CoV-2 RNA is generally detectable in upper respiratory specimens during the acute phase of infection. The lowest concentration of SARS-CoV-2 viral copies this assay can detect is 138 copies/mL. A negative result does not preclude SARS-Cov-2 infection and should not be used as the sole basis for treatment or other patient management decisions. A negative result may occur with  improper specimen collection/handling, submission of specimen other than nasopharyngeal swab, presence of viral mutation(s) within the areas targeted by this assay, and inadequate number of viral copies(<138 copies/mL). A negative result must be combined with clinical observations, patient history, and epidemiological  information. The expected result is Negative.  Fact Sheet for Patients:  BloggerCourse.com  Fact Sheet for Healthcare Providers:  SeriousBroker.it  This test is no t yet approved or cleared by the [place] FDA and  has been authorized for detection and/or diagnosis of SARS-CoV-2 by FDA under an Emergency Use Authorization (EUA). This EUA will remain  in effect (meaning this test can be used) for the duration of the COVID-19 declaration under Section 564(b)(1) of the Act, 21 U.S.C.section 360bbb-3(b)(1), unless the authorization is terminated  or revoked sooner.       Influenza A by PCR NEGATIVE NEGATIVE Final   Influenza B by PCR NEGATIVE NEGATIVE Final    Comment: (NOTE) The Xpert Xpress SARS-CoV-2/FLU/RSV plus assay is intended as an aid in the diagnosis of influenza from Nasopharyngeal swab specimens and should not be used as a sole basis for treatment. Nasal washings and aspirates are unacceptable for Xpert Xpress SARS-CoV-2/FLU/RSV testing.  Fact Sheet for Patients: BloggerCourse.com  Fact Sheet for Healthcare Providers: SeriousBroker.it  This test is not yet  approved or cleared by the [place] FDA and has been authorized for detection and/or diagnosis of SARS-CoV-2 by FDA under an Emergency Use Authorization (EUA). This EUA will remain in effect (meaning this test can be used) for the duration of the COVID-19 declaration under Section 564(b)(1) of the Act, 21 U.S.C. section 360bbb-3(b)(1), unless the authorization is terminated or revoked.  Performed at [facility], [address], [place], [place] [address]   Culture, blood (routine x 2)     Status: None (Preliminary result)   Collection Time: 07/09/20 11:35 PM   Specimen: BLOOD RIGHT HAND  Result Value Ref Range Status   Specimen Description BLOOD RIGHT HAND  Final   Special Requests   Final    BOTTLES DRAWN AEROBIC AND ANAEROBIC Blood Culture results may not be optimal due to an inadequate volume of blood received in culture bottles   Culture   Final    NO GROWTH 3 DAYS Performed at [facility], [address], [place], [place] [address]    Report Status PENDING  Incomplete  Culture, blood (routine x 2)     Status: None (Preliminary result)   Collection Time: 07/09/20 11:35 PM   Specimen: BLOOD LEFT HAND  Result Value Ref Range Status   Specimen Description BLOOD LEFT HAND  Final   Special Requests   Final    BOTTLES DRAWN AEROBIC AND ANAEROBIC Blood Culture results may not be optimal due to an inadequate volume of blood received in culture bottles   Culture   Final    NO GROWTH 3 DAYS Performed at [facility], [address], [place], [place] [address]    Report Status PENDING  Incomplete  Culture, respiratory (non-expectorated)     Status: None   Collection Time: 07/09/20 11:35 PM   Specimen: Tracheal Aspirate; Respiratory  Result Value Ref Range Status   Specimen Description TRACHEAL ASPIRATE  Final   Special Requests NONE  Final   Gram Stain   Final    RARE WBC PRESENT, PREDOMINANTLY PMN ABUNDANT GRAM POSITIVE COCCI RARE GRAM NEGATIVE RODS     Culture   Final    FEW Normal respiratory flora-no Staph aureus or Pseudomonas seen Performed at [facility], [address] [place], [place] [address]    Report Status 07/12/2020 FINAL  Final  MRSA PCR Screening     Status: None   Collection Time:  07/10/20 12:40 AM   Specimen: Nasopharyngeal  Result Value Ref Range Status   MRSA by PCR NEGATIVE NEGATIVE Final    Comment:        The GeneXpert MRSA Assay (FDA approved for NASAL specimens only), is one component of a comprehensive MRSA colonization surveillance program. It is not intended to diagnose MRSA infection nor to guide or monitor treatment for MRSA infections. Performed at [facility], [address] [place], [place] [address]      Time coordinating discharge: 40 minutes  SIGNED:   Alba Cory, MD  [facility]

## 2020-07-13 NOTE — TOC Transition Note (Signed)
Transition of Care [facility]) - CM/SW Discharge Note   Patient Details  Name: Kyle Payne MRN: [id number] Date of Birth: [birth year]  [facility] CM/SW Contact:  Bess Kinds, RN Phone Number: [phone] 07/13/2020, 9:02 AM   Clinical Narrative:     Spoke with patient at the bedside to discuss transition home. Patient stated that his sister is on her way to pick him up. Patient stated that [facility] to deliver oxygen to his home at 9:30 am. Spoke with Viviann Spare at [facility] Patient - portable oxygen will be delivered to [facility] this morning. No further TOC needs identified.   Final next level of care: Home/Self Care Barriers to Discharge: No Barriers Identified   Patient Goals and CMS Choice Patient states their goals for this hospitalization and ongoing recovery are:: back home with wife CMS Medicare.gov Compare Post Acute Care list provided to:: Patient Choice offered to / list presented to : Patient  Discharge Placement                       Discharge Plan and Services In-house Referral: NA Discharge Planning Services: CM Consult Post Acute Care Choice: Durable Medical Equipment          DME Arranged: Oxygen DME Agency: Patsy Lager ([facility]) Date DME Agency Contacted: 07/13/20 Time DME Agency Contacted: [phone] Representative spoke with at DME Agency: Waynetta Sandy and Viviann Spare HH Arranged: NA HH Agency: NA        Social Determinants of Health (SDOH) Interventions     Readmission Risk Interventions No flowsheet data found.

## 2020-07-15 LAB — CULTURE, BLOOD (ROUTINE X 2)
Culture: NO GROWTH
Culture: NO GROWTH

## 2020-07-22 DIAGNOSIS — R4182 Altered mental status, unspecified: Secondary | ICD-10-CM

## 2020-07-22 HISTORY — DX: Altered mental status, unspecified: R41.82

## 2020-07-26 DIAGNOSIS — J189 Pneumonia, unspecified organism: Secondary | ICD-10-CM

## 2020-07-26 HISTORY — DX: Pneumonia, unspecified organism: J18.9

## 2020-08-02 ENCOUNTER — Telehealth: Payer: Self-pay | Admitting: Cardiology

## 2020-08-02 NOTE — Telephone Encounter (Signed)
Please work him in, double book which ever is needed.

## 2020-08-02 NOTE — Telephone Encounter (Signed)
Left message for patient to return call.

## 2020-08-02 NOTE — Telephone Encounter (Signed)
Follow up: ° ° ° °Patient returning your call. Please call patient back. °

## 2020-08-02 NOTE — Telephone Encounter (Signed)
Patient is currently scheduled for 10/12/20 with Dr. Bing Matter. However, he is requesting to be seen within the next few weeks since he just got out of the hospital. I made him aware that Dr. Bing Matter does not have additional availability this month and he insisted that he must be seen sooner. Patient also states he is unable to travel to [facility]. Are we able to work him in? Please advise.

## 2020-08-02 NOTE — Telephone Encounter (Signed)
Called patient scheduled him for next week in [place]. No further questions.

## 2020-08-06 DIAGNOSIS — J449 Chronic obstructive pulmonary disease, unspecified: Secondary | ICD-10-CM | POA: Insufficient documentation

## 2020-08-10 ENCOUNTER — Ambulatory Visit: Payer: Medicare HMO | Admitting: Cardiology

## 2020-08-11 ENCOUNTER — Other Ambulatory Visit: Payer: Self-pay

## 2020-08-11 ENCOUNTER — Encounter: Payer: Self-pay | Admitting: Cardiology

## 2020-08-11 ENCOUNTER — Ambulatory Visit (INDEPENDENT_AMBULATORY_CARE_PROVIDER_SITE_OTHER): Payer: Medicare HMO

## 2020-08-11 ENCOUNTER — Ambulatory Visit: Payer: Medicare HMO

## 2020-08-11 ENCOUNTER — Ambulatory Visit: Payer: Medicare HMO | Admitting: Cardiology

## 2020-08-11 VITALS — BP 106/68 | HR 86 | Ht 73.0 in | Wt 189.0 lb

## 2020-08-11 DIAGNOSIS — R55 Syncope and collapse: Secondary | ICD-10-CM | POA: Insufficient documentation

## 2020-08-11 DIAGNOSIS — I452 Bifascicular block: Secondary | ICD-10-CM | POA: Insufficient documentation

## 2020-08-11 DIAGNOSIS — E782 Mixed hyperlipidemia: Secondary | ICD-10-CM | POA: Diagnosis not present

## 2020-08-11 HISTORY — DX: Syncope and collapse: R55

## 2020-08-11 HISTORY — DX: Bifascicular block: I45.2

## 2020-08-11 NOTE — Progress Notes (Signed)
Cardiology Office Note:    Date:  08/11/2020   ID:  Kyle Payne, DOB [birth year], MRN [id number]  PCP:  Olive Bass, MD  Cardiologist:  Gypsy Balsam, MD    Referring MD: Olive Bass, MD   Chief Complaint  Patient presents with  . Loss of Consciousness    Sob     . Shortness of Breath    History of Present Illness:    Kyle Payne is a 80 y.o. male I been following for years for vasovagal syncope still is quite interesting he always passed out in March however the last 2 to 3 years there was no issues.  In December he was getting ready to go to church he was sitting he tried to get up but his legs felt very weak and after that he was unable to get up also some issue about potentially having slurred speech.  Eventually he passed out he does not remember what happened from the moment he is feeling his legs being weak to the moment he is in the hospital so at least half an hour to 45 minutes In his memory.  In the hospital he eventually end up being intubated and treated for pneumonia.  Echocardiogram has been done which showed preserved left ventricle ejection fraction he was discharged home with diagnosis of pneumonia possibly aspiration pneumonia, syncope unclear etiology.  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  I did review EKGs from hospital there was some episode of bradycardia.  Overall he is doing better still complaining for some shortness of breath as well as some cough.  No dizziness no passing out.  Past Medical History:  Diagnosis Date  . Acute respiratory failure with hypoxia and hypercapnia (HCC) 07/09/2020  . Altered mental status 07/22/2020  . Bronchitis, complicated 01/21/2020  . Community acquired pneumonia 07/26/2020   Formatting of this note might be different from the original. 06/2020: hosp, intubated  . COPD (chronic obstructive pulmonary disease) (HCC)   . Encephalopathy 10/30/2017   Formatting of this note might be  different from the original. 06/2020: hosp resp failure, CAP  . GERD (gastroesophageal reflux disease) 01/28/2016  . Hyperlipidemia, mixed 01/28/2016   Intolerant to multiple medications  . 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 Obstructive sleep apnea 01/28/2016  . Plantar fasciitis of left foot 06/30/2019  . 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 INGUINAL HERNIA REPAIR    . ROTATOR CUFF REPAIR      Current Medications: Current Meds  Medication Sig  . amitriptyline (ELAVIL) 75 MG tablet Take 25 mg by mouth at bedtime.  Marland Kitchen aspirin EC 81 MG tablet Take 1 tablet (81 mg total) by mouth daily. Swallow whole.  . Cyanocobalamin 2000 MCG TBCR Take 2,000 mcg by mouth daily.  Marland Kitchen gabapentin (NEURONTIN) 800 MG tablet Take 800 mg by mouth 4 (four) times daily.  . Multiple Vitamins-Minerals (CENTRUM ADULTS) TABS Take 1 tablet by mouth daily.  . Omega-3 Fatty Acids (FISH OIL) 1200 MG CAPS Take 2,400 mg by mouth daily.  Marland Kitchen omeprazole (PRILOSEC) 20 MG capsule Take 20 mg by mouth daily.     Allergies:   Niacin and related   Social History   Socioeconomic History  . Marital status: Married    Spouse name: Not on file  . Number of children: Not on  file  . Years of education: Not on file  . Highest education level: Not on file  Occupational History  . Not on file  Tobacco Use  . Smoking status: Former Games developer  . Smokeless tobacco: Never Used  Vaping Use  . Vaping Use: Never used  Substance and Sexual Activity  . Alcohol use: Never  . Drug use: Never  . Sexual activity: Not on file  Other Topics Concern  . Not on file  Social History Narrative  . Not on file   Social Determinants of Health   Financial Resource Strain: Not on file  Food Insecurity: Not on file  Transportation Needs: Not on file  Physical Activity: Not on file  Stress: Not on file  Social  Connections: Not on file     Family History: The patient's family history includes Breast cancer in his sister and sister; Hypertension in his father; Stroke in his father. ROS:   Please see the history of present illness.    All 14 point review of systems negative except as described per history of present illness  EKGs/Labs/Other Studies Reviewed:      Recent Labs: 07/09/2020: ALT 21; TSH 5.855 07/12/2020: Hemoglobin 13.2; Platelets 181 07/13/2020: BUN 14; Creatinine, Ser 0.74; Potassium 4.0; Sodium 137  Recent Lipid Panel    Component Value Date/Time   CHOL 182 07/11/2020 0322   CHOL 252 (H) 01/07/2018 1040   TRIG 54 07/11/2020 0322   TRIG 53 07/11/2020 0322   HDL 38 (L) 07/11/2020 0322   HDL 45 01/07/2018 1040   CHOLHDL 4.8 07/11/2020 0322   VLDL 11 07/11/2020 0322   LDLCALC 133 (H) 07/11/2020 0322   LDLCALC 180 (H) 01/07/2018 1040    Physical Exam:    VS:  BP 106/68 (BP Location: Right Arm, Patient Position: Sitting)   Pulse 86   Ht 6\' 1"  (1.854 m)   Wt 189 lb (85.7 kg)   SpO2 91%   BMI 24.94 kg/m     Wt Readings from Last 3 Encounters:  08/11/20 189 lb (85.7 kg)  07/12/20 186 lb 8.2 oz (84.6 kg)  01/07/18 181 lb (82.1 kg)     GEN:  Well nourished, well developed in no acute distress HEENT: Normal NECK: No JVD; No carotid bruits LYMPHATICS: No lymphadenopathy CARDIAC: RRR, no murmurs, no rubs, no gallops RESPIRATORY:  Clear to auscultation without rales, wheezing or rhonchi  ABDOMEN: Soft, non-tender, non-distended MUSCULOSKELETAL:  No edema; No deformity  SKIN: Warm and dry LOWER EXTREMITIES: no swelling NEUROLOGIC:  Alert and oriented x 3 PSYCHIATRIC:  Normal affect   ASSESSMENT:    1. Hyperlipidemia, mixed   2. Vasovagal syncope   3. Bifascicular block    PLAN:    In order of problems listed above:  1. Vasovagal syncope however the episode she describes today looks unlikely for vasovagal event I am aware about potentially arrhythmia and  bradycardia.  Asking to wear Zio patch for 1 week to see if he got any significant arrhythmia.  I did stressed again importance of staying well-hydrated as well as taking salty food he understand he will try to do that. 2. Bifascicular block.  We will put Zio patch to make sure there is no significant arrhythmia. 3. History of pneumonia still some rhonchi on auscultation he is getting better but still complain having some shortness of breath.  He did not completely recovered from it. 4. Essential hypertension blood pressure seems to be well controlled. 5. Dyslipidemia: I do have his  fasting lipid profile showing LDL of 133 HDL 38.  We will continue conversation about potentially going on statin which will be beneficial.  I do not want to start that medication right now until I have better clarification of his symptomatology.  I did review record from hospital for this visit.  Medication Adjustments/Labs and Tests Ordered: Current medicines are reviewed at length with the patient today.  Concerns regarding medicines are outlined above.  No orders of the defined types were placed in this encounter.  Medication changes: No orders of the defined types were placed in this encounter.   Signed, Georgeanna Lea, MD, [facility] 08/11/2020 2:17 PM    [facility]

## 2020-08-11 NOTE — Patient Instructions (Signed)
Medication Instructions:  Your physician recommends that you continue on your current medications as directed. Please refer to the Current Medication list given to you today.  *If you need a refill on your cardiac medications before your next appointment, please call your pharmacy*   Lab Work: none If you have labs (blood work) drawn today and your tests are completely normal, you will receive your results only by: Marland Kitchen MyChart Message (if you have MyChart) OR . A paper copy in the mail If you have any lab test that is abnormal or we need to change your treatment, we will call you to review the results.   Testing/Procedures: A zio monitor was ordered today. It will remain on for 7 days. You will then return monitor and event diary in provided box. It takes 1-2 weeks for report to be downloaded and returned to [place]. We will call you with the results. If monitor falls off or has orange flashing light, please call Zio for further instructions.      Follow-Up: At [facility], you and your health needs are our priority.  As part of our continuing mission to provide you with exceptional heart care, we have created designated Provider Care Teams.  These Care Teams include your primary Cardiologist (physician) and Advanced Practice Providers (APPs -  Physician Assistants and Nurse Practitioners) who all work together to provide you with the care you need, when you need it.  We recommend signing up for the patient portal called "MyChart".  Sign up information is provided on this After Visit Summary.  MyChart is used to connect with patients for Virtual Visits (Telemedicine).  Patients are able to view lab/test results, encounter notes, upcoming appointments, etc.  Non-urgent messages can be sent to your provider as well.   To learn more about what you can do with MyChart, go to [email or web address].    Your next appointment:   2 month(s)  The format for your next appointment:   In  Person  Provider:   Gypsy Balsam, MD   Other Instructions

## 2020-08-11 NOTE — Addendum Note (Signed)
Addended by: Hazle Quant on: 08/11/2020 02:23 PM   Modules accepted: Orders

## 2020-08-18 DIAGNOSIS — R55 Syncope and collapse: Secondary | ICD-10-CM

## 2020-08-27 DIAGNOSIS — U071 COVID-19: Secondary | ICD-10-CM | POA: Insufficient documentation

## 2020-08-27 HISTORY — DX: COVID-19: U07.1

## 2020-10-11 ENCOUNTER — Other Ambulatory Visit: Payer: Self-pay

## 2020-10-12 ENCOUNTER — Ambulatory Visit: Payer: Medicare HMO | Admitting: Cardiology

## 2020-10-12 ENCOUNTER — Encounter: Payer: Self-pay | Admitting: Cardiology

## 2020-10-12 ENCOUNTER — Other Ambulatory Visit: Payer: Self-pay

## 2020-10-12 VITALS — BP 98/58 | HR 68 | Ht 72.0 in | Wt 186.0 lb

## 2020-10-12 DIAGNOSIS — E782 Mixed hyperlipidemia: Secondary | ICD-10-CM

## 2020-10-12 DIAGNOSIS — I452 Bifascicular block: Secondary | ICD-10-CM

## 2020-10-12 DIAGNOSIS — R55 Syncope and collapse: Secondary | ICD-10-CM | POA: Diagnosis not present

## 2020-10-12 NOTE — Patient Instructions (Signed)

## 2020-10-12 NOTE — Progress Notes (Signed)
Cardiology Office Note:    Date:  10/12/2020   ID:  Kyle Payne, DOB 1940/10/06, MRN [id number]  PCP:  Olive Bass, MD  Cardiologist:  Gypsy Balsam, MD    Referring MD: Olive Bass, MD   Chief Complaint  Patient presents with  . Follow-up  I am doing fine  History of Present Illness:    Kyle Payne is a 80 y.o. male  I been following for years for vasovagal syncope still is quite interesting he always passed out in March however the last 2 to 3 years there was no issues.  In December he was getting ready to go to church he was sitting he tried to get up but his legs felt very weak and after that he was unable to get up also some issue about potentially having slurred speech.  Eventually he passed out he does not remember what happened from the moment he is feeling his legs being weak to the moment he is in the hospital so at least half an hour to 45 minutes In his memory.  In the hospital he eventually end up being intubated and treated for pneumonia.  Echocardiogram has been done which showed preserved left ventricle ejection fraction he was discharged home with diagnosis of pneumonia possibly aspiration pneumonia, syncope unclear etiology.  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  I did review EKGs from hospital there was some episode of bradycardia.   Comes today 2 months of follow-up overall doing great.  Asymptomatic no dizziness no passing out overall doing well but described before it is more short of breath done he is being this is a gradual process going on over the last few years.  We did talk about potentially doing some investigation about it but he does not want to do that.  Past Medical History:  Diagnosis Date  . Acute respiratory failure with hypoxia and hypercapnia (HCC) 07/09/2020  . Altered mental status 07/22/2020  . Bifascicular block 08/11/2020  . Bronchitis, complicated 01/21/2020  . Community acquired pneumonia 07/26/2020    Formatting of this note might be different from the original. 06/2020: hosp, intubated  . COPD (chronic obstructive pulmonary disease) (HCC)   . COVID-19 determined by clinical diagnostic criteria 08/27/2020   Formatting of this note might be different from the original. 08/27/2020  . Encephalopathy 10/30/2017   Formatting of this note might be different from the original. 06/2020: hosp resp failure, CAP  . GERD (gastroesophageal reflux disease) 01/28/2016  . Hyperlipidemia, mixed 01/28/2016   Intolerant to multiple medications  . 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 Obstructive sleep apnea 01/28/2016  . Plantar fasciitis of left foot 06/30/2019  . 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 Vasovagal syncope 08/11/2020    Past Surgical History:  Procedure Laterality Date  . INGUINAL HERNIA REPAIR    . ROTATOR CUFF REPAIR      Current Medications: Current Meds  Medication Sig  . amitriptyline (ELAVIL) 75 MG tablet Take 25 mg by mouth at bedtime.  Marland Kitchen aspirin EC 81 MG tablet Take 1 tablet (81 mg total) by mouth daily. Swallow whole.  . Cyanocobalamin 2000 MCG TBCR Take 2,000 mcg by mouth daily.  Marland Kitchen gabapentin (NEURONTIN) 800 MG tablet Take 800 mg by mouth 4 (four) times daily.  . Multiple Vitamins-Minerals (CENTRUM ADULTS) TABS Take 1 tablet by mouth daily.  . Omega-3 Fatty  Acids (FISH OIL) 1200 MG CAPS Take 2,400 mg by mouth daily.  Marland Kitchen omeprazole (PRILOSEC) 20 MG capsule Take 20 mg by mouth daily.     Allergies:   Niacin and related   Social History   Socioeconomic History  . Marital status: Married    Spouse name: Not on file  . Number of children: Not on file  . Years of education: Not on file  . Highest education level: Not on file  Occupational History  . Not on file  Tobacco Use  . Smoking status: Former Games developer  . Smokeless tobacco: Never Used  Vaping Use  . Vaping Use: Never used  Substance and  Sexual Activity  . Alcohol use: Never  . Drug use: Never  . Sexual activity: Not on file  Other Topics Concern  . Not on file  Social History Narrative  . Not on file   Social Determinants of Health   Financial Resource Strain: Not on file  Food Insecurity: Not on file  Transportation Needs: Not on file  Physical Activity: Not on file  Stress: Not on file  Social Connections: Not on file     Family History: The patient's family history includes Breast cancer in his sister and sister; Hypertension in his father; Stroke in his father. ROS:   Please see the history of present illness.    All 14 point review of systems negative except as described per history of present illness  EKGs/Labs/Other Studies Reviewed:      Recent Labs: 07/09/2020: ALT 21; TSH 5.855 07/12/2020: Hemoglobin 13.2; Platelets 181 07/13/2020: BUN 14; Creatinine, Ser 0.74; Potassium 4.0; Sodium 137  Recent Lipid Panel    Component Value Date/Time   CHOL 182 07/11/2020 0322   CHOL 252 (H) 01/07/2018 1040   TRIG 54 07/11/2020 0322   TRIG 53 07/11/2020 0322   HDL 38 (L) 07/11/2020 0322   HDL 45 01/07/2018 1040   CHOLHDL 4.8 07/11/2020 0322   VLDL 11 07/11/2020 0322   LDLCALC 133 (H) 07/11/2020 0322   LDLCALC 180 (H) 01/07/2018 1040    Physical Exam:    VS:  BP (!) 98/58 (BP Location: Left Arm, Patient Position: Sitting)   Pulse 68   Ht 6' (1.829 m)   Wt 186 lb (84.4 kg)   SpO2 (!) 68%   BMI 25.23 kg/m     Wt Readings from Last 3 Encounters:  10/12/20 186 lb (84.4 kg)  08/11/20 189 lb (85.7 kg)  07/12/20 186 lb 8.2 oz (84.6 kg)     GEN:  Well nourished, well developed in no acute distress HEENT: Normal NECK: No JVD; No carotid bruits LYMPHATICS: No lymphadenopathy CARDIAC: RRR, no murmurs, no rubs, no gallops RESPIRATORY:  Clear to auscultation without rales, wheezing or rhonchi  ABDOMEN: Soft, non-tender, non-distended MUSCULOSKELETAL:  No edema; No deformity  SKIN: Warm and  dry LOWER EXTREMITIES: no swelling NEUROLOGIC:  Alert and oriented x 3 PSYCHIATRIC:  Normal affect   ASSESSMENT:    1. Bifascicular block   2. Hyperlipidemia, mixed   3. Vasovagal syncope    PLAN:    In order of problems listed above:  1. Vasovagal syncope.  Not denies having any problems I did stress importance of being well-hydrated as well as we did talk about counterpressure maneuvers he can do when he feels the sensation is coming. 2. Bifascicular block with episode of syncope before.  I did Zio patch on him showed some supraventricular tachycardia but no significant bradycardia that could  explain syncope. 3. Hyperlipidemia.  He is intolerant to multiple statin.  He is lipid panel is still elevated but he does not want to do anything about it.   Medication Adjustments/Labs and Tests Ordered: Current medicines are reviewed at length with the patient today.  Concerns regarding medicines are outlined above.  No orders of the defined types were placed in this encounter.  Medication changes: No orders of the defined types were placed in this encounter.   Signed, Georgeanna Lea, MD, [facility] 10/12/2020 4:41 PM    [facility]

## 2020-12-23 DIAGNOSIS — J449 Chronic obstructive pulmonary disease, unspecified: Secondary | ICD-10-CM | POA: Insufficient documentation

## 2020-12-23 DIAGNOSIS — Z8719 Personal history of other diseases of the digestive system: Secondary | ICD-10-CM | POA: Insufficient documentation

## 2020-12-23 DIAGNOSIS — R7989 Other specified abnormal findings of blood chemistry: Secondary | ICD-10-CM | POA: Insufficient documentation

## 2020-12-23 DIAGNOSIS — J9611 Chronic respiratory failure with hypoxia: Secondary | ICD-10-CM | POA: Insufficient documentation

## 2020-12-28 DIAGNOSIS — Z09 Encounter for follow-up examination after completed treatment for conditions other than malignant neoplasm: Secondary | ICD-10-CM | POA: Insufficient documentation

## 2020-12-28 DIAGNOSIS — K8000 Calculus of gallbladder with acute cholecystitis without obstruction: Secondary | ICD-10-CM | POA: Insufficient documentation

## 2021-04-12 IMAGING — CT CT HEAD CODE STROKE
4 of 6 series · 15 of 47 positions shown, 16 images · non-contrast
Comparison: 10/15/2017

CLINICAL DATA: Code stroke.  Left-sided weakness.

EXAM:
CT HEAD WITHOUT CONTRAST
TECHNIQUE: Contiguous axial images were obtained from the base of the skull
through the vertex without intravenous contrast.

[Series 3: head wo · axial · 0.37mm/px · z∈[+52,+198]mm · 3 of 37 slices shown, 4 images]
[im 1/37  brain]
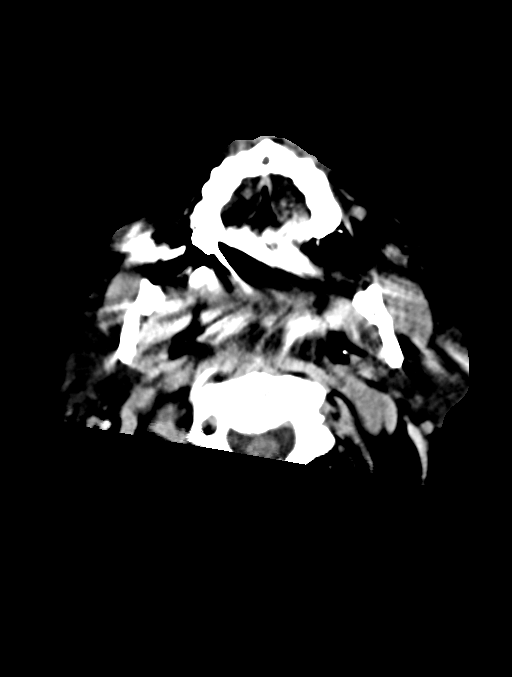
[im 1/37  bone]
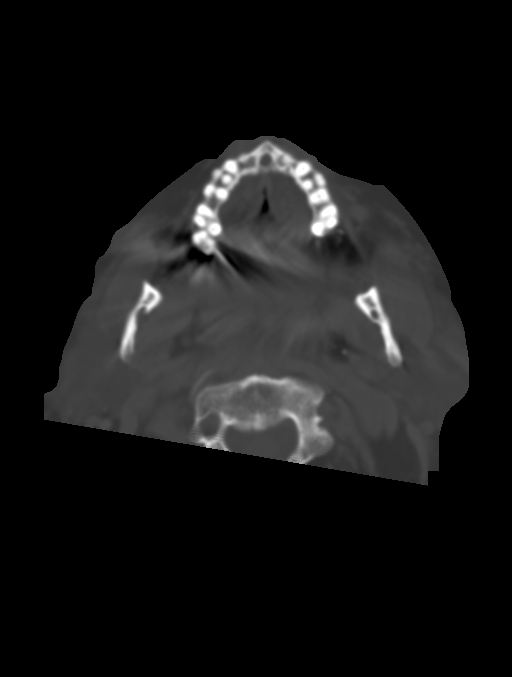
[im 19/37  brain]
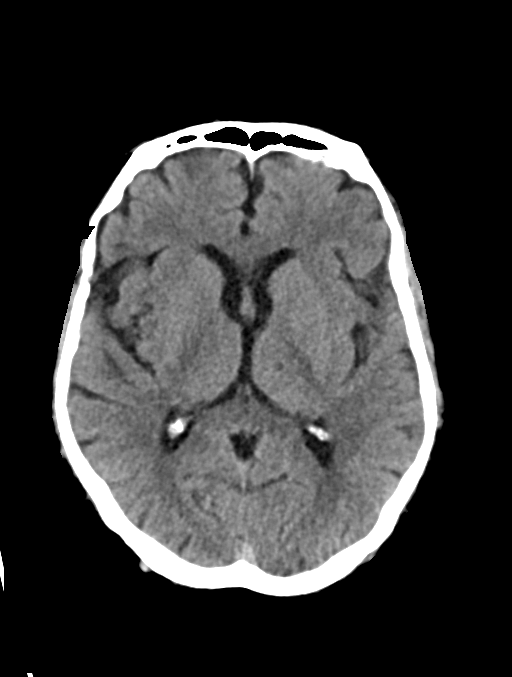
[im 37/37  brain]
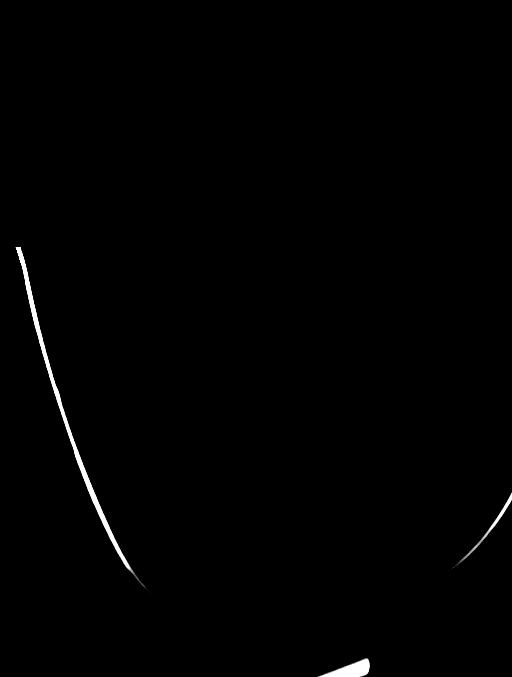

[Series 4: head bone · axial · 0.49mm/px · z∈[-12,+98]mm · 6 of 89 slices shown]
[im 12/89  bone]
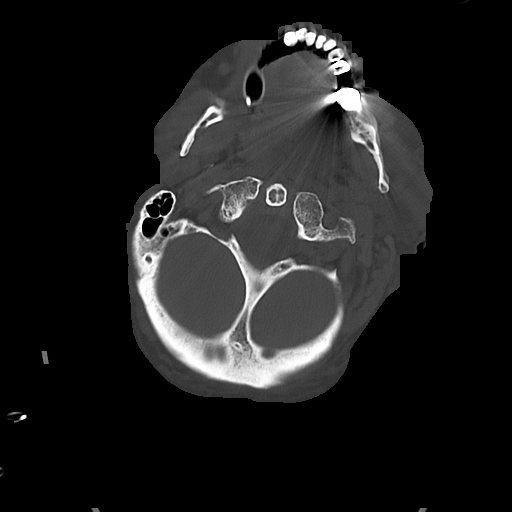
[im 23/89  bone]
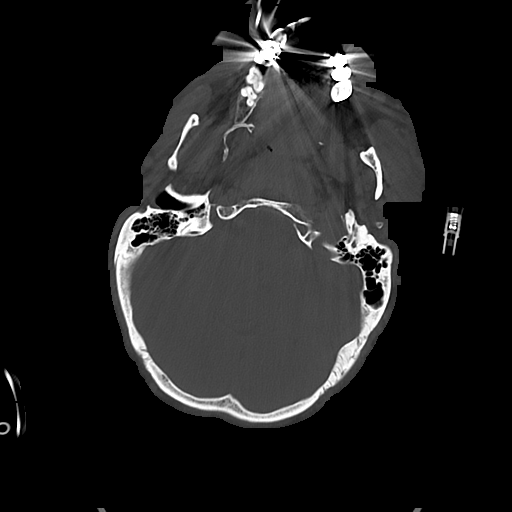
[im 34/89  bone]
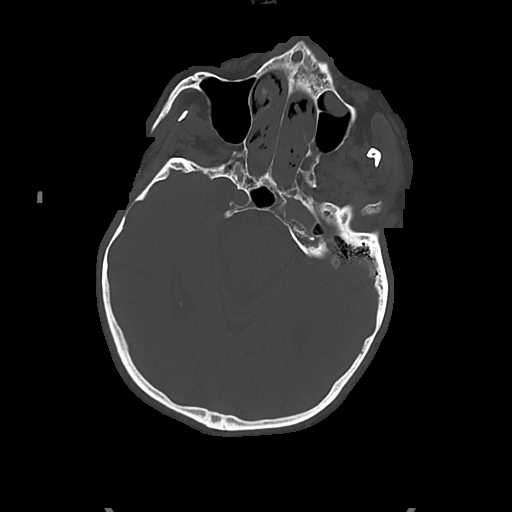
[im 45/89  bone]
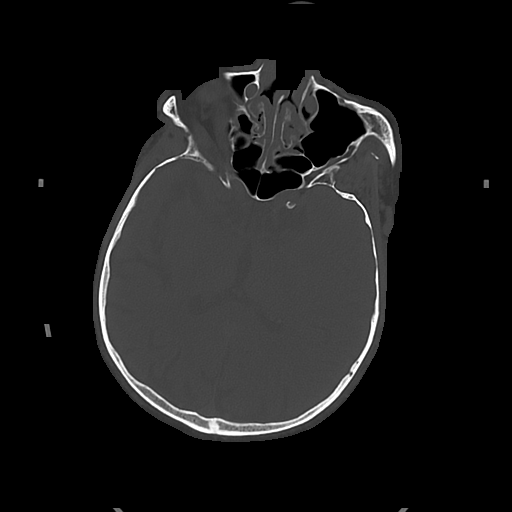
[im 56/89  bone]
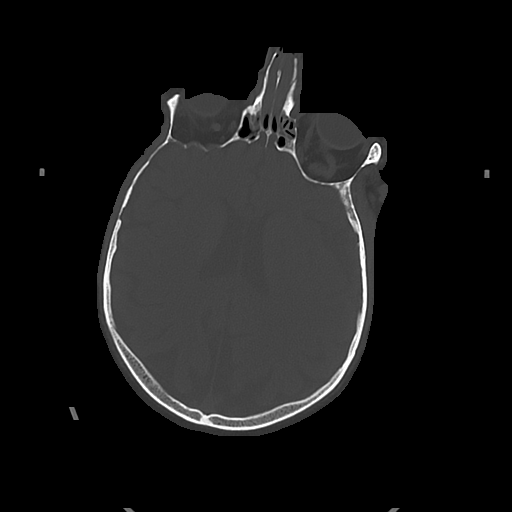
[im 67/89  bone]
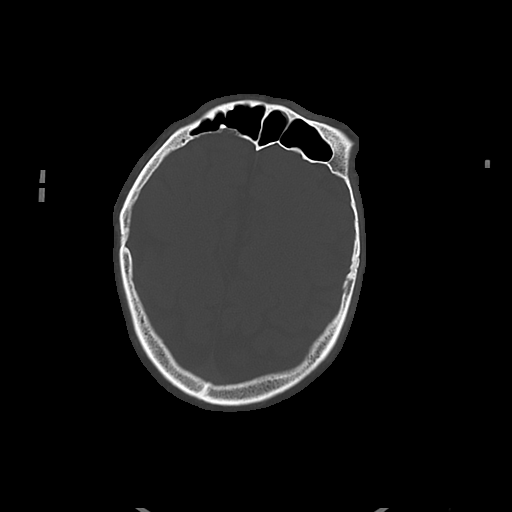

[Series 5: cor soft · coronal · 0.37mm/px · 3 of 83 slices shown]
[im 34/83  brain]
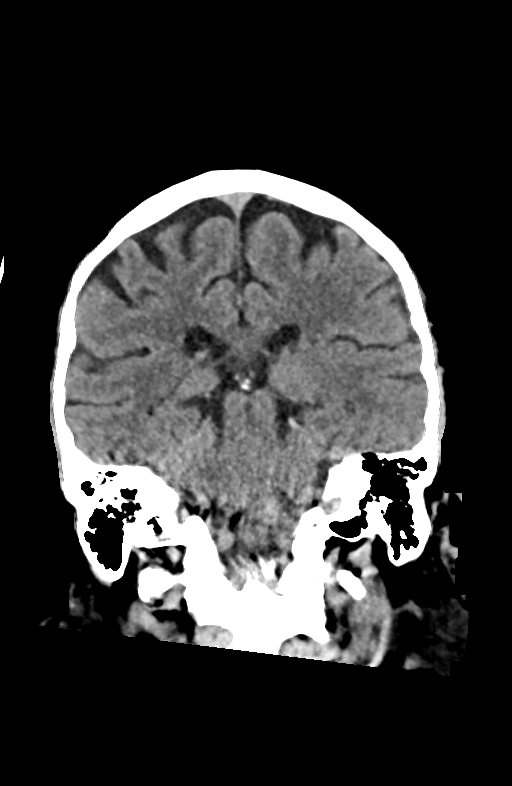
[im 39/83  brain]
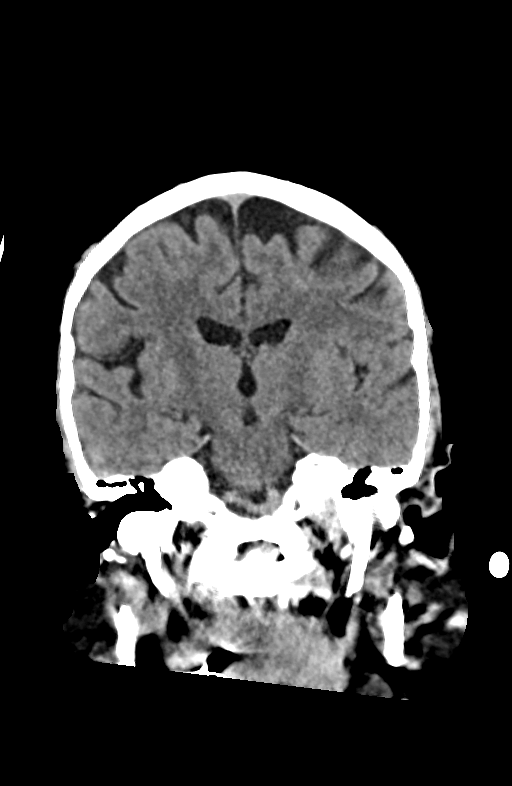
[im 44/83  brain]
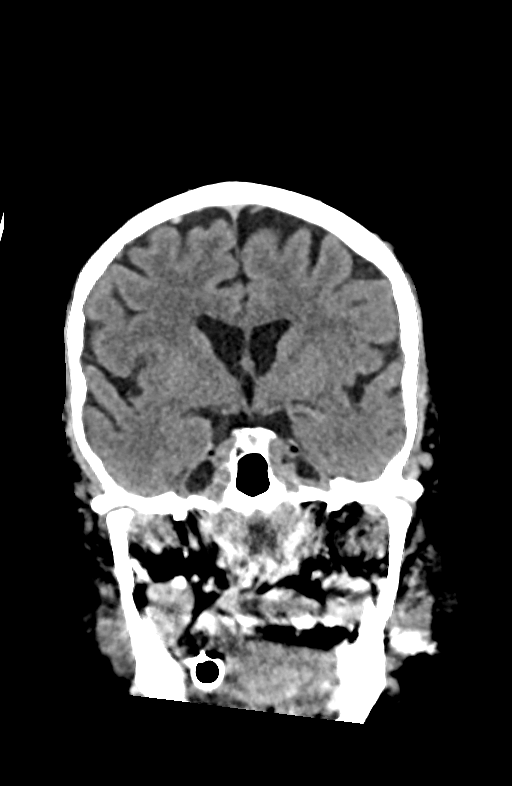

[Series 6: sag soft · sagittal · 0.49mm/px · 3 of 64 slices shown]
[im 22/64  brain]
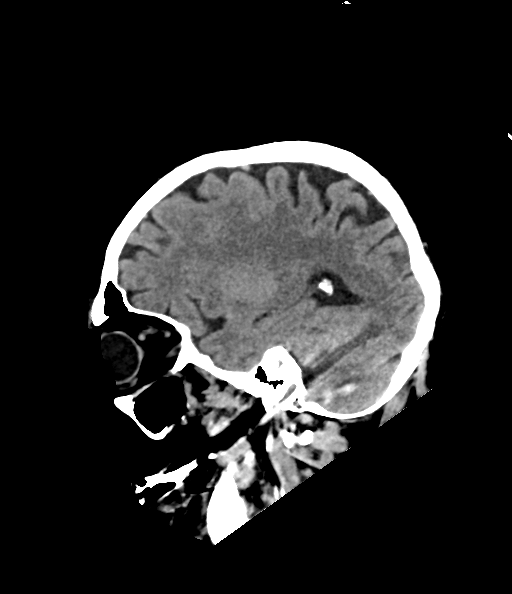
[im 32/64  brain]
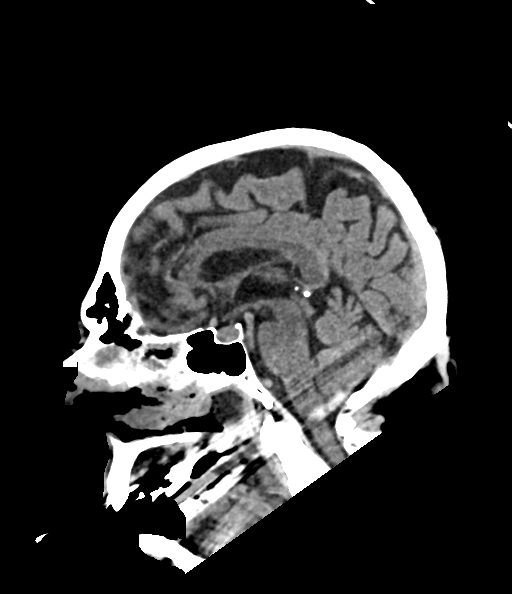
[im 43/64  brain]
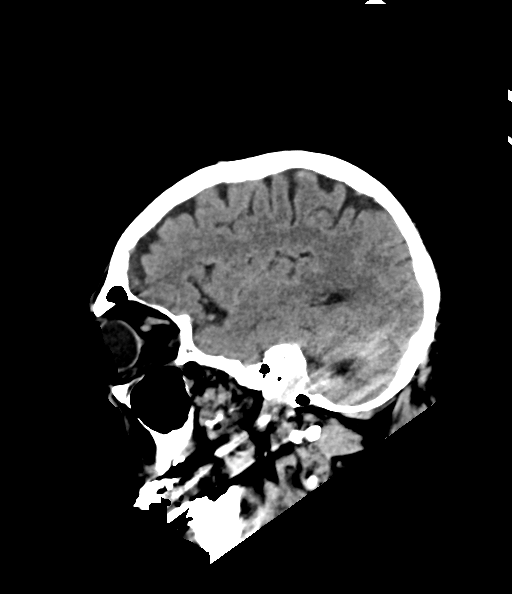

[15 of 47 positions shown; findings below may reference images not displayed]

FINDINGS: Brain: There is some sort a of artifact in the posterior fossa.
Cerebellar and lower occipital infarction can not be excluded.
Otherwise, cerebral hemispheres do not show any evidence of acute
infarction. Mild age related volume loss. No hemorrhage,
hydrocephalus or extra-axial collection.

Vascular: There is atherosclerotic calcification of the major
vessels at the base of the brain.

Skull: Chronic lucent area in the right frontal region, consistent
with a benign finding.

Sinuses/Orbits: Clear/normal

Other: None

ASPECTS (Alberta Stroke Program Early CT Score)

- Ganglionic level infarction (caudate, lentiform nuclei, internal
capsule, insula, M1-M3 cortex): 7

- Supraganglionic infarction (M4-M6 cortex): 3

Total score (0-10 with 10 being normal): 10
IMPRESSION: 1. Some sort of artifact in the posterior fossa. Cerebellar and
lower occipital infarction can not be excluded. Otherwise, no acute
finding.
2. ASPECTS is 10.
3. These results were communicated to Dr. Klever at [DATE] pmon
07/09/2020by text page via the [employer] messaging system.

## 2021-04-14 ENCOUNTER — Ambulatory Visit: Payer: Medicare HMO | Admitting: Cardiology

## 2021-04-14 ENCOUNTER — Other Ambulatory Visit: Payer: Self-pay

## 2021-04-14 ENCOUNTER — Encounter: Payer: Self-pay | Admitting: Cardiology

## 2021-04-14 VITALS — BP 92/70 | HR 76 | Ht 72.0 in | Wt 183.0 lb

## 2021-04-14 DIAGNOSIS — E782 Mixed hyperlipidemia: Secondary | ICD-10-CM

## 2021-04-14 DIAGNOSIS — G4733 Obstructive sleep apnea (adult) (pediatric): Secondary | ICD-10-CM

## 2021-04-14 DIAGNOSIS — I452 Bifascicular block: Secondary | ICD-10-CM

## 2021-04-14 NOTE — Patient Instructions (Signed)
Medication Instructions:  Your physician recommends that you continue on your current medications as directed. Please refer to the Current Medication list given to you today.  *If you need a refill on your cardiac medications before your next appointment, please call your pharmacy*   Lab Work: Your physician recommends that you return for lab work in:  TODAY: Direct LDL If you have labs (blood work) drawn today and your tests are completely normal, you will receive your results only by: MyChart Message (if you have MyChart) OR A paper copy in the mail If you have any lab test that is abnormal or we need to change your treatment, we will call you to review the results.   Testing/Procedures: None   Follow-Up: At [facility], you and your health needs are our priority.  As part of our continuing mission to provide you with exceptional heart care, we have created designated Provider Care Teams.  These Care Teams include your primary Cardiologist (physician) and Advanced Practice Providers (APPs -  Physician Assistants and Nurse Practitioners) who all work together to provide you with the care you need, when you need it.  We recommend signing up for the patient portal called "MyChart".  Sign up information is provided on this After Visit Summary.  MyChart is used to connect with patients for Virtual Visits (Telemedicine).  Patients are able to view lab/test results, encounter notes, upcoming appointments, etc.  Non-urgent messages can be sent to your provider as well.   To learn more about what you can do with MyChart, go to [email or web address].    Your next appointment:   6 month(s)  The format for your next appointment:   In Person  Provider:   Robert Krasowski, MD   Other Instructions   

## 2021-04-14 NOTE — Progress Notes (Signed)
ect

## 2021-04-14 NOTE — Progress Notes (Signed)
Cardiology Office Note:    Date:  04/14/2021   ID:  Kyle Payne, DOB [birth year], MRN [id number]  PCP:  Kyle Bass, MD  Cardiologist:  Kyle Balsam, MD    Referring MD: Kyle Bass, MD   Chief Complaint  Patient presents with   Follow-up  I am doing fine  History of Present Illness:    Kyle Payne is a 80 y.o. male with past medical history significant for vasovagal syncope, he does have quite interesting story almost every year around March he is passing out however last time I seen him for about 2 to 3 years, no episode of syncope in December however he ended up passing out was weak tired exhausted he was found to have pneumonia spent some time in the hospital eventually was discharged.  He had to be intubated.  He is coming today to my office for follow-up overall doing very well.  Denies have any chest pain tightness squeezing pressure burning chest no more syncope.  He is working hard in the garden have no difficulty doing it.  Past Medical History:  Diagnosis Date   Acute respiratory failure with hypoxia and hypercapnia (HCC) 07/09/2020   Altered mental status 07/22/2020   Bifascicular block 08/11/2020   Bronchitis, complicated 01/21/2020   Community acquired pneumonia 07/26/2020   Formatting of this note might be different from the original. 06/2020: hosp, intubated   COPD (chronic obstructive pulmonary disease) (HCC)    COVID-19 determined by clinical diagnostic criteria 08/27/2020   Formatting of this note might be different from the original. 08/27/2020   Encephalopathy 10/30/2017   Formatting of this note might be different from the original. 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   aspirin EC 81 MG tablet Take 1 tablet (81 mg total) by mouth daily. Swallow whole.   Cyanocobalamin 2000 MCG TBCR Take 2,000 mcg by mouth daily.   gabapentin (NEURONTIN) 800 MG tablet Take 800 mg by mouth 4 (four) times daily.   Multiple Vitamins-Minerals (CENTRUM ADULTS) TABS Take 1 tablet by mouth daily.   Omega-3 Fatty Acids (FISH OIL) 1200 MG CAPS Take 2,400 mg by mouth daily.   omeprazole (PRILOSEC) 20 MG capsule Take 20 mg by mouth daily.     Allergies:   Niacin and related   Social History   Socioeconomic History   Marital status: Married    Spouse name: Not on file   Number of children: Not on file   Years of education: Not on file   Highest education level: Not on file  Occupational History   Not on file  Tobacco Use   Smoking status: Former   Smokeless tobacco: Never  Vaping Use   Vaping Use: Never used  Substance and Sexual Activity   Alcohol use: Never   Drug use: Never   Sexual activity: Not  on file  Other Topics Concern   Not on file  Social History Narrative   Not on file   Social Determinants of Health   Financial Resource Strain: Not on file  Food Insecurity: Not on file  Transportation Needs: Not on file  Physical Activity: Not on file  Stress: Not on file  Social Connections: Not on file     Family History: The patient's family history includes Breast cancer in his sister and sister; Hypertension in his father; Stroke in his father. ROS:   Please see the history of present illness.    All 14 point review of systems negative except as described per history of present illness  EKGs/Labs/Other Studies Reviewed:       Recent Labs: 07/09/2020: ALT 21; TSH 5.855 07/12/2020: Hemoglobin 13.2; Platelets 181 07/13/2020: BUN 14; Creatinine, Ser 0.74; Potassium 4.0; Sodium 137  Recent Lipid Panel    Component Value Date/Time   CHOL 182 07/11/2020 0322   CHOL 252 (H) 01/07/2018 1040   TRIG 54 07/11/2020 0322   TRIG 53 07/11/2020 0322   HDL 38 (L) 07/11/2020 0322   HDL 45 01/07/2018 1040   CHOLHDL 4.8 07/11/2020 0322   VLDL 11 07/11/2020 0322   LDLCALC 133 (H) 07/11/2020 0322   LDLCALC 180 (H) 01/07/2018 1040    Physical Exam:    VS:  BP 92/70 (BP Location: Right Arm, Patient Position: Sitting, Cuff Size: Normal)   Pulse 76   Ht 6' (1.829 m)   Wt 183 lb (83 kg)   SpO2 93%   BMI 24.82 kg/m     Wt Readings from Last 3 Encounters:  04/14/21 183 lb (83 kg)  10/12/20 186 lb (84.4 kg)  08/11/20 189 lb (85.7 kg)     GEN:  Well nourished, well developed in no acute distress HEENT: Normal NECK: No JVD; No carotid bruits LYMPHATICS: No lymphadenopathy CARDIAC: RRR, no murmurs, no rubs, no gallops RESPIRATORY:  Clear to auscultation without rales, wheezing or rhonchi  ABDOMEN: Soft, non-tender, non-distended MUSCULOSKELETAL:  No edema; No deformity  SKIN: Warm and dry LOWER EXTREMITIES: no swelling NEUROLOGIC:  Alert and oriented x 3 PSYCHIATRIC:  Normal affect   ASSESSMENT:    1. Hyperlipidemia, mixed   2. Obstructive sleep apnea   3. Bifascicular block    PLAN:    In order of problems listed above:  Vasovagal syncope.  Seems to be doing well from that point review again I stressed importance of taking plenty of fluids and liberating salt intake.  I told him 1 more time about counterpressure maneuvers. Obstructive sleep apnea: On CPAP mask. Bifascicular block repeatedly.  Monitor present place and never we find any worsening of the condition he does have some episode of supraventricular tachycardia which is asymptomatic and benign to 4 would not treat this   Medication  Adjustments/Labs and Tests Ordered: Current medicines are reviewed at length with the patient today.  Concerns regarding medicines are outlined above.  Orders Placed This Encounter  Procedures   Direct LDL   Medication changes: No orders of the defined types were placed in this encounter.   Signed, Georgeanna Lea, MD, [facility] 04/14/2021 4:18 PM    [facility]

## 2021-04-15 ENCOUNTER — Telehealth: Payer: Self-pay

## 2021-04-15 DIAGNOSIS — E782 Mixed hyperlipidemia: Secondary | ICD-10-CM

## 2021-04-15 LAB — LDL CHOLESTEROL, DIRECT: LDL Direct: 127 mg/dL — ABNORMAL HIGH (ref 0–99)

## 2021-04-15 IMAGING — DX DG CHEST 1V PORT
1 series · 1 of 1 positions shown · non-contrast
Comparison: July 10, 2020

CLINICAL DATA: Pneumonia

EXAM:
PORTABLE CHEST 1 VIEW

[chest]
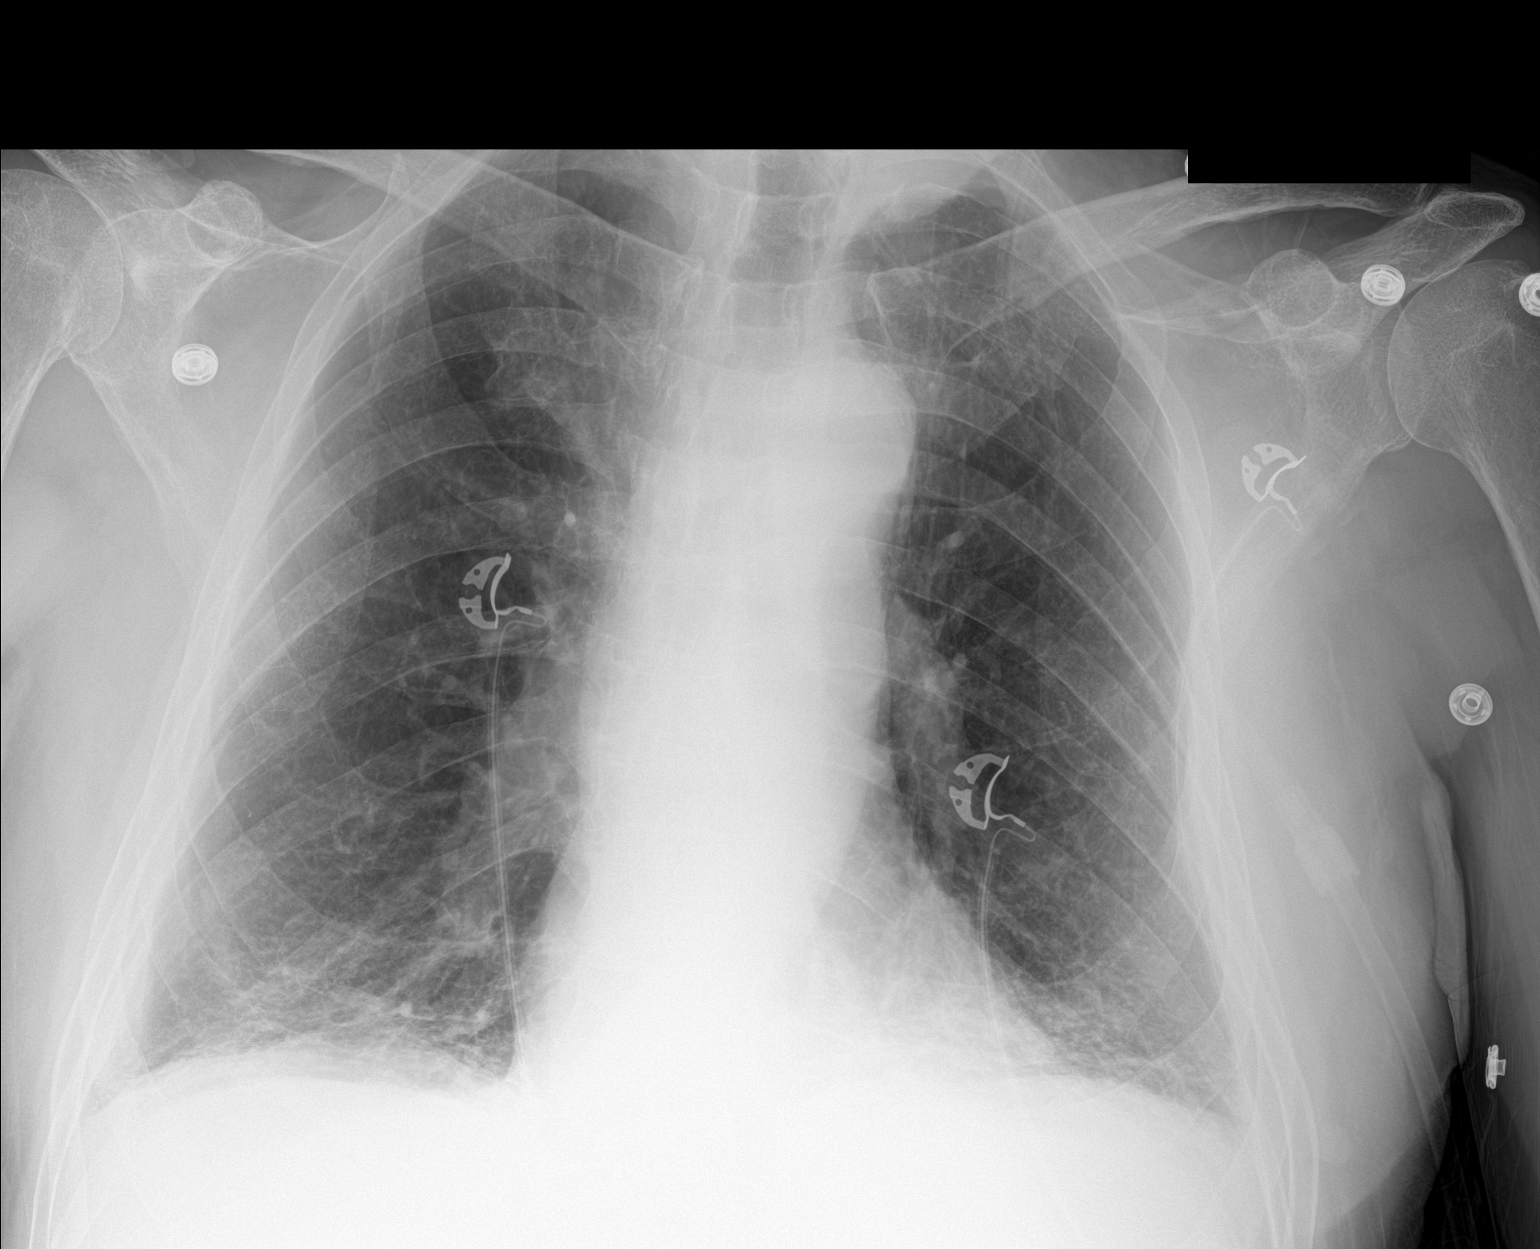

[1 of 1 positions shown; findings below may reference images not displayed]

FINDINGS: Endotracheal tube and nasogastric tube have been removed. No
pneumothorax. There is patchy airspace opacity in each lung base.
There is stable apical pleural thickening bilaterally. Lungs
elsewhere are clear. Heart size is normal with pulmonary vascularity
normal. No adenopathy. No bone lesions.
IMPRESSION: Patchy bibasilar opacity, concerning for atelectasis with
superimposed pneumonia. Lungs elsewhere clear except for stable
apical pleural thickening. Stable cardiac silhouette. No
pneumothorax.

## 2021-04-15 MED ORDER — PRAVASTATIN SODIUM 20 MG PO TABS: 20.0000 mg | ORAL_TABLET | Freq: Every evening | ORAL | 3 refills | Status: DC

## 2021-04-15 NOTE — Telephone Encounter (Signed)
Spoke with patient regarding results and recommendation.  Patient verbalizes understanding and is agreeable to plan of care. Advised patient to call back with any issues or concerns.  

## 2021-04-15 NOTE — Telephone Encounter (Signed)
-----   Message from Georgeanna Lea, MD sent at 04/15/2021  1:13 PM EDT ----- Cholesterol limited LDL 127 ideally need to be less than 100.  If he is willing I would recommend stop pravastatin 20 mg daily, fasting lipid profile, AST ALT he should happen within the next 6 weeks

## 2021-06-21 DIAGNOSIS — T40601D Poisoning by unspecified narcotics, accidental (unintentional), subsequent encounter: Secondary | ICD-10-CM | POA: Insufficient documentation

## 2021-10-06 ENCOUNTER — Ambulatory Visit: Payer: Medicare HMO | Admitting: Cardiology

## 2021-10-07 ENCOUNTER — Encounter: Payer: Self-pay | Admitting: Cardiology

## 2021-10-07 ENCOUNTER — Ambulatory Visit: Payer: Medicare HMO | Admitting: Cardiology

## 2021-10-07 ENCOUNTER — Other Ambulatory Visit: Payer: Self-pay

## 2021-10-07 VITALS — BP 122/88 | HR 76 | Ht 72.0 in | Wt 190.0 lb

## 2021-10-07 DIAGNOSIS — E782 Mixed hyperlipidemia: Secondary | ICD-10-CM

## 2021-10-07 DIAGNOSIS — R55 Syncope and collapse: Secondary | ICD-10-CM

## 2021-10-07 DIAGNOSIS — I452 Bifascicular block: Secondary | ICD-10-CM | POA: Diagnosis not present

## 2021-10-07 DIAGNOSIS — G4733 Obstructive sleep apnea (adult) (pediatric): Secondary | ICD-10-CM

## 2021-10-07 NOTE — Progress Notes (Signed)
?Cardiology Office Note:   ? ?Date:  10/07/2021  ? ?IDCHRISTIANJAMES Payne, DOB 02/19/1941, MRN [id number] ? ?PCP:  Olive Bass, MD  ?Cardiologist:  Gypsy Balsam, MD   ? ?Referring MD: Olive Bass, MD  ? ?Chief Complaint  ?Patient presents with  ? Follow-up  ?Doing fine ? ?History of Present Illness:   ? ?Kyle Payne is a 81 y.o. male with past medical history significant for vasovagal syncope, also dyslipidemia with intolerance to multiple statin, bifascicular block.  He comes today to my office for follow-up.  Overall he seems to be doing very well.  There is no more episode of dizziness or syncope.  Still very busy he said that he have to pace himself but denies have any chest pain tightness squeezing pressure burning chest. ? ?Past Medical History:  ?Diagnosis Date  ? Acute respiratory failure with hypoxia and hypercapnia (HCC) 07/09/2020  ? Altered mental status 07/22/2020  ? Bifascicular block 08/11/2020  ? Bronchitis, complicated 01/21/2020  ? Community acquired pneumonia 07/26/2020  ? Formatting of this note might be different from the original. 06/2020: hosp, intubated  ? COPD (chronic obstructive pulmonary disease) (HCC)   ? COVID-19 determined by clinical diagnostic criteria 08/27/2020  ? Formatting of this note might be different from the original. 08/27/2020  ? Encephalopathy 10/30/2017  ? Formatting of this note might be different from the original. 06/2020: hosp resp failure, CAP  ? GERD (gastroesophageal reflux disease) 01/28/2016  ? Hyperlipidemia, mixed 01/28/2016  ? Intolerant to multiple medications  ? 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 Formatting of this note might be different from the original. 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 Obstructive sleep apnea 01/28/2016  ? Plantar fasciitis of left foot 06/30/2019  ? 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 Vasovagal syncope 08/11/2020  ? ? ?Past Surgical History:  ?Procedure Laterality Date  ? INGUINAL HERNIA REPAIR    ? ROTATOR CUFF REPAIR    ? ? ?Current  Medications: ?Current Meds  ?Medication Sig  ? amitriptyline (ELAVIL) 75 MG tablet Take 75 mg by mouth at bedtime.  ? Cyanocobalamin 2000 MCG TBCR Take 2,000 mcg by mouth daily.  ? gabapentin (NEURONTIN) 800 MG tablet Take 800 mg by mouth 4 (four) times daily.  ? Multiple Vitamins-Minerals (CENTRUM ADULTS) TABS Take 1 tablet by mouth daily.  ? omeprazole (PRILOSEC) 20 MG capsule Take 20 mg by mouth daily.  ? [DISCONTINUED] Omega-3 Fatty Acids (FISH OIL) 1200 MG CAPS Take 2,400 mg by mouth daily.  ?  ? ?Allergies:   Niacin and related  ? ?Social History  ? ?Socioeconomic History  ? Marital status: Married  ?  Spouse name: Not on file  ? Number of children: Not on file  ? Years of education: Not on file  ? Highest education level: Not on file  ?Occupational History  ? Not on file  ?Tobacco Use  ? Smoking status: Former  ? Smokeless tobacco: Never  ?Vaping Use  ? Vaping Use: Never used  ?Substance and Sexual Activity  ? Alcohol use: Never  ? Drug use: Never  ? Sexual activity: Not on file  ?Other Topics Concern  ? Not on file  ?Social History Narrative  ? Not on file  ? ?Social Determinants of Health  ? ?Financial Resource Strain: Not on file  ?Food Insecurity: Not on file  ?Transportation Needs: Not on file  ?Physical Activity: Not on file  ?Stress: Not on file  ?Social Connections: Not on file  ?  ? ?  Family History: ?The patient's family history includes Breast cancer in his sister and sister; Hypertension in his father; Stroke in his father. ?ROS:   ?Please see the history of present illness.    ?All 14 point review of systems negative except as described per history of present illness ? ?EKGs/Labs/Other Studies Reviewed:   ? ? ? ?Recent Labs: ?No results found for requested labs within last 8760 hours.  ?Recent Lipid Panel ?   ?Component Value Date/Time  ? CHOL 182 07/11/2020 0322  ? CHOL 252 (H) 01/07/2018 1040  ? TRIG 54 07/11/2020 0322  ? TRIG 53 07/11/2020 0322  ? HDL 38 (L) 07/11/2020 0322  ? HDL 45 01/07/2018  1040  ? CHOLHDL 4.8 07/11/2020 0322  ? VLDL 11 07/11/2020 0322  ? LDLCALC 133 (H) 07/11/2020 0322  ? LDLCALC 180 (H) 01/07/2018 1040  ? LDLDIRECT 127 (H) 04/14/2021 1621  ? ? ?Physical Exam:   ? ?VS:  BP 122/88 (BP Location: Left Arm, Patient Position: Sitting)   Pulse 76   Ht 6' (1.829 m)   Wt 190 lb (86.2 kg)   SpO2 92%   BMI 25.77 kg/m?    ? ?Wt Readings from Last 3 Encounters:  ?10/07/21 190 lb (86.2 kg)  ?04/14/21 183 lb (83 kg)  ?10/12/20 186 lb (84.4 kg)  ?  ? ?GEN:  Well nourished, well developed in no acute distress ?HEENT: Normal ?NECK: No JVD; No carotid bruits ?LYMPHATICS: No lymphadenopathy ?CARDIAC: RRR, no murmurs, no rubs, no gallops ?RESPIRATORY:  Clear to auscultation without rales, wheezing or rhonchi  ?ABDOMEN: Soft, non-tender, non-distended ?MUSCULOSKELETAL:  No edema; No deformity  ?SKIN: Warm and dry ?LOWER EXTREMITIES: no swelling ?NEUROLOGIC:  Alert and oriented x 3 ?PSYCHIATRIC:  Normal affect  ? ?ASSESSMENT:   ? ?1. Bifascicular block   ?2. Obstructive sleep apnea   ?3. Vasovagal syncope   ?4. Hyperlipidemia, mixed   ? ?PLAN:   ? ?In order of problems listed above: ? ?Bifascicular block which is chronic problem unknown.  No symptomatology lately. ?Obstructive sleep apnea that being addressed by internal medicine team ?Vasovagal syncope knows about the need to stay well-hydrated he also knows counterpressure maneuvers. ?Dyslipidemia with intolerance to multiple statins, I did review data from primary care physician last LDL I see is from 2016.  But patient told his straight that he does not want to try any new medications he had intolerance to multiple statins and he thinks he 2 oh to get and any medications. ? ? ?Medication Adjustments/Labs and Tests Ordered: ?Current medicines are reviewed at length with the patient today.  Concerns regarding medicines are outlined above.  ?No orders of the defined types were placed in this encounter. ? ?Medication changes: No orders of the defined  types were placed in this encounter. ? ? ?Signed, ?Georgeanna Lea, MD, [facility] ?10/07/2021 9:33 AM    ?[facility] ?

## 2021-10-07 NOTE — Patient Instructions (Signed)

## 2022-03-02 DIAGNOSIS — G5621 Lesion of ulnar nerve, right upper limb: Secondary | ICD-10-CM | POA: Insufficient documentation

## 2022-03-02 DIAGNOSIS — M5412 Radiculopathy, cervical region: Secondary | ICD-10-CM | POA: Insufficient documentation

## 2022-04-12 ENCOUNTER — Encounter: Payer: Self-pay | Admitting: Cardiology

## 2022-04-12 ENCOUNTER — Ambulatory Visit: Payer: Medicare HMO | Attending: Cardiology | Admitting: Cardiology

## 2022-04-12 VITALS — BP 104/72 | HR 83 | Ht 72.0 in | Wt 176.2 lb

## 2022-04-12 DIAGNOSIS — G4733 Obstructive sleep apnea (adult) (pediatric): Secondary | ICD-10-CM

## 2022-04-12 DIAGNOSIS — I452 Bifascicular block: Secondary | ICD-10-CM | POA: Diagnosis not present

## 2022-04-12 DIAGNOSIS — J449 Chronic obstructive pulmonary disease, unspecified: Secondary | ICD-10-CM

## 2022-04-12 DIAGNOSIS — R55 Syncope and collapse: Secondary | ICD-10-CM

## 2022-04-12 NOTE — Patient Instructions (Signed)

## 2022-04-12 NOTE — Progress Notes (Signed)
Cardiology Office Note:    Date:  04/12/2022   ID:  Kyle Payne Kyle Payne, DOB 20-Aug-1940, MRN [id number]  PCP:  Olive Bass, MD  Cardiologist:  Gypsy Balsam, MD    Referring MD: Olive Bass, MD   Chief Complaint  Patient presents with   Follow-up  Doing well  History of Present Illness:    Kyle Payne is a 81 y.o. male with past medical history significant for vasovagal syncope, dyslipidemia intolerant to multiple statin, bifascicular block.  He is in my office today for follow-up overall he is doing very well.  He denies have any chest pain, tightness, pressure, burning in the chest.  Overall holding well  Past Medical History:  Diagnosis Date   Acute respiratory failure with hypoxia and hypercapnia (HCC) 07/09/2020   Altered mental status 07/22/2020   Bifascicular block 08/11/2020   Bronchitis, complicated 01/21/2020   Community acquired pneumonia 07/26/2020   Formatting of this note might be different from the original. 06/2020: hosp, intubated   COPD (chronic obstructive pulmonary disease) (HCC)    COVID-19 determined by clinical diagnostic criteria 08/27/2020   Formatting of this note might be different from the original. 08/27/2020   Encephalopathy 10/30/2017   Formatting of this note might be different from the original. 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    Cyanocobalamin 2000 MCG TBCR Take 2,000 mcg by mouth daily.   gabapentin (NEURONTIN) 800 MG tablet Take 800 mg by mouth 4 (four) times daily.   Multiple Vitamins-Minerals (CENTRUM ADULTS) TABS Take 1 tablet by mouth daily.   omeprazole (PRILOSEC) 20 MG capsule Take 20 mg by mouth daily.   pravastatin (PRAVACHOL) 20 MG tablet Take 1 tablet (20 mg total) by mouth every evening.     Allergies:   Niacin and related and Rosuvastatin   Social History   Socioeconomic History   Marital status: Married    Spouse name: Not on file   Number of children: Not on file   Years of education: Not on file   Highest education level: Not on file  Occupational History   Not on file  Tobacco Use   Smoking status: Former   Smokeless tobacco: Never  Vaping Use   Vaping Use: Never used  Substance and Sexual Activity   Alcohol use: Never   Drug use: Never   Sexual activity: Not on file  Other Topics Concern   Not on file  Social History Narrative   Not on file   Social Determinants of Health   Financial Resource Strain: Not on file  Food Insecurity: Not on file  Transportation Needs: Not on file  Physical Activity: Not on file  Stress: Not on file  Social Connections: Not on file     Family History: The patient's family history includes Breast cancer in his sister and sister; Hypertension in  his father; Stroke in his father. ROS:   Please see the history of present illness.    All 14 point review of systems negative except as described per history of present illness  EKGs/Labs/Other Studies Reviewed:      Recent Labs: No results found for requested labs within last 365 days.  Recent Lipid Panel    Component Value Date/Time   CHOL 182 07/11/2020 0322   CHOL 252 (H) 01/07/2018 1040   TRIG 54 07/11/2020 0322   TRIG 53 07/11/2020 0322   HDL 38 (L) 07/11/2020 0322   HDL 45 01/07/2018 1040   CHOLHDL 4.8 07/11/2020 0322   VLDL 11 07/11/2020 0322   LDLCALC 133 (H) 07/11/2020 0322    LDLCALC 180 (H) 01/07/2018 1040   LDLDIRECT 127 (H) 04/14/2021 1621    Physical Exam:    VS:  BP 104/72 (BP Location: Left Arm, Patient Position: Sitting)   Pulse 83   Ht 6' (1.829 m)   Wt 176 lb 3.2 oz (79.9 kg)   SpO2 92%   BMI 23.90 kg/m     Wt Readings from Last 3 Encounters:  04/12/22 176 lb 3.2 oz (79.9 kg)  10/07/21 190 lb (86.2 kg)  04/14/21 183 lb (83 kg)     GEN:  Well nourished, well developed in no acute distress HEENT: Normal NECK: No JVD; No carotid bruits LYMPHATICS: No lymphadenopathy CARDIAC: RRR, no murmurs, no rubs, no gallops RESPIRATORY:  Clear to auscultation without rales, wheezing or rhonchi  ABDOMEN: Soft, non-tender, non-distended MUSCULOSKELETAL:  No edema; No deformity  SKIN: Warm and dry LOWER EXTREMITIES: no swelling NEUROLOGIC:  Alert and oriented x 3 PSYCHIATRIC:  Normal affect   ASSESSMENT:    1. Bifascicular block   2. Obstructive sleep apnea   3. Chronic obstructive pulmonary disease, unspecified COPD type ([place])   4. Vasovagal syncope    PLAN:    In order of problems listed above:  Bifascicular block.  Unchanged.  Continue present management History of vasovagal syncope I stressed again importance of drinking plenty of fluid which he does.  Denies having episode of syncope COPD, on chronic stable noted Dyslipidemia which is ongoing problem.  I did review K PN which show me his LDL 133 HDL 38 again and brought issue of taking cholesterol medication he is only straight there is nothing I can do to make him to take those medications he does not want to take care of it.  He said he is 81 years old he does not care about his cholesterol.  In this case we will simply continue monitoring   Medication Adjustments/Labs and Tests Ordered: Current medicines are reviewed at length with the patient today.  Concerns regarding medicines are outlined above.  No orders of the defined types were placed in this encounter.  Medication changes: No  orders of the defined types were placed in this encounter.   Signed, Park Liter, MD, [facility] 04/12/2022 11:46 AM    [place]

## 2022-04-25 DIAGNOSIS — S41112D Laceration without foreign body of left upper arm, subsequent encounter: Secondary | ICD-10-CM | POA: Insufficient documentation

## 2022-08-04 DIAGNOSIS — J209 Acute bronchitis, unspecified: Secondary | ICD-10-CM | POA: Insufficient documentation

## 2022-10-12 ENCOUNTER — Ambulatory Visit: Payer: Medicare HMO | Admitting: Cardiology

## 2022-11-08 ENCOUNTER — Encounter: Payer: Self-pay | Admitting: Cardiology

## 2022-11-08 ENCOUNTER — Ambulatory Visit: Payer: Medicare HMO | Attending: Cardiology | Admitting: Cardiology

## 2022-11-08 VITALS — BP 110/70 | HR 70 | Ht 72.0 in | Wt 177.8 lb

## 2022-11-08 DIAGNOSIS — I493 Ventricular premature depolarization: Secondary | ICD-10-CM | POA: Diagnosis not present

## 2022-11-08 DIAGNOSIS — E782 Mixed hyperlipidemia: Secondary | ICD-10-CM

## 2022-11-08 DIAGNOSIS — R55 Syncope and collapse: Secondary | ICD-10-CM | POA: Diagnosis not present

## 2022-11-08 DIAGNOSIS — I452 Bifascicular block: Secondary | ICD-10-CM | POA: Diagnosis not present

## 2022-11-08 NOTE — Progress Notes (Signed)
Cardiology Office Note:    Date:  11/08/2022   ID:  Kyle Payne, DOB 1941/07/23, MRN [id number]  PCP:  Olive Bass, MD   [facility] Providers Cardiologist:  Gypsy Balsam, MD     Referring MD: Olive Bass, MD   CC: follow up   History of Present Illness:    Kyle Payne is a 82 y.o. male with a hx of COPD, GERD, vasovagal syncope, hyperlipidemia intolerant to statins, bifascicular block, OSA, PVCs.  [facility]abl[facility] with [facility] initially for management of vasovagal syncope.   Carotid ultrasound in 2019 was without evidence of stenosis bilaterally. Echocardiogram in 2019 revealed an EF of 60 to 65%, impaired relaxation, borderline concentric LVH, mild MR, mild TR. patient wore a 30-day monitor 2019 predominant baseline rhythm was sinus, 2 runs of narrow complex tachycardia were noted.  Most recent echo in 2021 revealed EF 60 to 65%, mild concentric LVH, grade 1 DD, mild MR, largely unchanged from previous imaging.  Most recent monitor was in 2022 which revealed multiple episodes of SVT.  Most recently he was evaluated by Dr. Bing Matter on 04/12/2022, at that time he was doing well from a cardiac perspective without any complaints.  Lipid management was discussed however he has been intolerant of statins and was not interested in managing his hyperlipidemia.  He presents today for general cardiology follow-up.  He has no complaints, has not had any recurrent syncopal episodes in the last 4 years.  Does not do any formal exercise however states he is outside working in the garden most of the days weather permitting. He denies chest pain, palpitations, dyspnea, pnd, orthopnea, n, v, dizziness, syncope, edema, weight gain, or early satiety.   Past Medical History:  Diagnosis Date   Acute respiratory failure with hypoxia and hypercapnia 07/09/2020   Altered mental status 07/22/2020   Bifascicular block 08/11/2020   Bronchitis, complicated 01/21/2020   Community acquired  pneumonia 07/26/2020   Formatting of this note might be different from the original. 06/2020: hosp, intubated   COPD (chronic obstructive pulmonary disease)    COVID-19 determined by clinical diagnostic criteria 08/27/2020   Formatting of this note might be different from the original. 08/27/2020   Encephalopathy 10/30/2017   Formatting of this note might be different from the original. 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   aspirin EC 81 MG tablet Take 81 mg by mouth daily. Swallow whole.   Cyanocobalamin 2000 MCG TBCR Take 2,000 mcg by mouth daily.   gabapentin (NEURONTIN) 800 MG tablet Take 800 mg by mouth 4 (four) times daily.     Allergies:   Niacin, Niacin and related, and Rosuvastatin   Social History   Socioeconomic History   Marital status: Married    Spouse name: Not on file   Number of children: Not on file   Years  of education: Not on file   Highest education level: Not on file  Occupational History   Not on file  Tobacco Use   Smoking status: Former   Smokeless tobacco: Never  Vaping Use   Vaping Use: Never used  Substance and Sexual Activity   Alcohol use: Never   Drug use: Never   Sexual activity: Not on file  Other Topics Concern   Not on file  Social History Narrative   Not on file   Social Determinants of Health   Financial Resource Strain: Not on  file  Food Insecurity: Not on file  Transportation Needs: Not on file  Physical Activity: Not on file  Stress: Not on file  Social Connections: Not on file     Family History: The patient's family history includes Breast cancer in his sister and sister; Hypertension in his father; Stroke in his father.  ROS:   Please see the history of present illness.     All other systems reviewed and are negative.  EKGs/Labs/Other Studies Reviewed:    The following studies were reviewed today:  Cardiac Studies & Procedures       ECHOCARDIOGRAM  ECHOCARDIOGRAM COMPLETE 07/10/2020  Narrative ECHOCARDIOGRAM REPORT    Patient Name:   Kyle Payne Date of Exam: 07/10/2020 Medical Rec #:  [id number]     Height:       72.0 in Accession #:    [id number]    Weight:       191.4 lb Date of Birth:  Aug 02, 1940     BSA:          2.091 m Patient Age:    79 years      BP:           112/84 mmHg Patient Gender: M             HR:           76 bpm. Exam Location:  Inpatient  Procedure: 2D Echo  Indications:    TIA 435.9 / G45.9  History:        Patient has no prior history of Echocardiogram examinations. Signs/Symptoms:Syncope; Risk Factors:Dyslipidemia and Former Smoker. Acute respiratory failure with hypoxia and hypercapnia.  Sonographer:    Jeryl Columbia Referring Phys: [id number] LAURA P CLARK  IMPRESSIONS   1. Left ventricular ejection fraction, by estimation, is 60 to 65%. The left ventricle has normal function. The left ventricle has no regional wall motion abnormalities. There is mild concentric left ventricular hypertrophy. Left ventricular diastolic parameters are consistent with Grade I diastolic dysfunction (impaired relaxation). 2. Right ventricular systolic function is normal. The right ventricular size is moderately enlarged. 3. The mitral valve is normal in structure. Mild mitral valve regurgitation. 4. The aortic valve is tricuspid. Aortic valve regurgitation is not  visualized.  Conclusion(s)/Recommendation(s): No intracardiac source of embolism detected on this transthoracic study. A transesophageal echocardiogram is recommended to exclude cardiac source of embolism if clinically indicated.  FINDINGS Left Ventricle: Left ventricular ejection fraction, by estimation, is 60 to 65%. The left ventricle has normal function. The left ventricle has no regional wall motion abnormalities. The left ventricular internal cavity size was normal in size. There is mild concentric left ventricular hypertrophy. Left ventricular diastolic parameters are consistent with Grade I diastolic dysfunction (impaired relaxation).  Right Ventricle: The right ventricular size is moderately enlarged. Right vetricular wall thickness was not well visualized. Right ventricular systolic function is normal.  Left Atrium: Left atrial size was normal in size.  Right Atrium: Right atrial size was normal in size.  Pericardium: There is no evidence of pericardial effusion.  Mitral Valve: The mitral valve is normal in structure. There is mild thickening of the mitral valve leaflet(s). Mild mitral annular calcification. Mild mitral valve regurgitation.  Tricuspid Valve: The tricuspid valve is normal in structure. Tricuspid valve regurgitation is trivial.  Aortic Valve: The aortic valve is tricuspid. Aortic valve regurgitation is not visualized.  Pulmonic Valve: The pulmonic valve was normal in structure. Pulmonic valve regurgitation is not visualized.  Aorta: The aortic root is normal in size and structure.  Venous: IVC assessment for right atrial pressure unable to be performed due to mechanical ventilation.  IAS/Shunts: No atrial level shunt detected by color flow Doppler.   LEFT VENTRICLE PLAX 2D LVIDd:         4.20 cm  Diastology LVIDs:         2.60 cm  LV e' medial:    6.85 cm/s LV PW:         0.90 cm  LV E/e' medial:  13.8 LV IVS:        1.20 cm  LV e' lateral:   9.46  cm/s LVOT diam:     2.10 cm  LV E/e' lateral: 10.0 LVOT Area:     3.46 cm   RIGHT VENTRICLE RV S prime:     13.40 cm/s TAPSE (M-mode): 2.6 cm  LEFT ATRIUM             Index       RIGHT ATRIUM          Index LA diam:        3.50 cm 1.67 cm/m  RA Area:     9.51 cm LA Vol (A2C):   37.2 ml 17.79 ml/m RA Volume:   20.40 ml 9.76 ml/m LA Vol (A4C):   32.9 ml 15.73 ml/m LA Biplane Vol: 34.8 ml 16.64 ml/m  AORTA Ao Root diam: 2.90 cm  MITRAL VALVE MV Area (PHT): 3.16 cm    SHUNTS MV Decel Time: 240 msec    Systemic Diam: 2.10 cm MV E velocity: 94.70 cm/s MV A velocity: 97.30 cm/s MV E/A ratio:  0.97  Laurance Flatten MD Electronically signed by Laurance Flatten MD Signature Date/Time: 07/10/2020/1:22:51 PM    Final    MONITORS  LONG TERM MONITOR (3-14 DAYS) 08/26/2020  Narrative Patch Wear Time:  6 days and 17 hours (2022-01-19T14:27:03-499 to 2022-01-26T08:21:19-0500)  Patient had a min HR of 49 bpm, max HR of 190 bpm, and avg HR of 88 bpm. Predominant underlying rhythm was Sinus Rhythm. Bundle Branch Block/IVCD was present. 10 Supraventricular Tachycardia runs occurred, the run with the fastest interval lasting 20 beats with a max rate of 190 bpm (avg 163 bpm); the run with the fastest interval was also the longest. Isolated SVEs were frequent (5.8%, 49472), SVE Couplets were rare (<1.0%, 3078), and SVE Triplets were rare (<1.0%, 188). Isolated VEs were rare (<1.0%), VE Couplets were rare (<1.0%), and no VE Triplets were present.  Summary conclusions: 10 episode of supraventricular tachycardia, asymptomatic. Triggered event showing sinus tachycardia. Baseline intraventricular conduction delay            EKG:  EKG is not ordered today.    Recent Labs: No results found for requested labs within last 365 days.  Recent Lipid Panel    Component Value Date/Time   CHOL 182 07/11/2020 0322   CHOL 252 (H) 01/07/2018 1040   TRIG 54 07/11/2020  0322   TRIG 53  07/11/2020 0322   HDL 38 (L) 07/11/2020 0322   HDL 45 01/07/2018 1040   CHOLHDL 4.8 07/11/2020 0322   VLDL 11 07/11/2020 0322   LDLCALC 133 (H) 07/11/2020 0322   LDLCALC 180 (H) 01/07/2018 1040   LDLDIRECT 127 (H) 04/14/2021 1621     Risk Assessment/Calculations:                Physical Exam:    VS:  BP 110/70 (BP Location: Right Arm, Patient Position: Sitting)   Pulse 70   Ht 6' (1.829 m)   Wt 177 lb 12.8 oz (80.6 kg)   SpO2 95%   BMI 24.11 kg/m     Wt Readings from Last 3 Encounters:  11/08/22 177 lb 12.8 oz (80.6 kg)  04/12/22 176 lb 3.2 oz (79.9 kg)  10/07/21 190 lb (86.2 kg)     GEN:  Well nourished, well developed in no acute distress HEENT: Normal NECK: No JVD; No carotid bruits LYMPHATICS: No lymphadenopathy CARDIAC: Regular rate with frequent skipped beats, no murmurs, rubs, gallops RESPIRATORY:  Clear to auscultation without rales, wheezing or rhonchi  ABDOMEN: Soft, non-tender, non-distended MUSCULOSKELETAL:  No edema; No deformity  SKIN: Warm and dry NEUROLOGIC:  Alert and oriented x 3 PSYCHIATRIC:  Normal affect   ASSESSMENT:    1. Bifascicular block   2. Vasovagal syncope   3. Hyperlipidemia, mixed   4. PVC's (premature ventricular contractions)    PLAN:    In order of problems listed above:  Bifascicular block/PVCs -patient denies dizziness, syncope or syncope, denies palpitations.  During physical examination auscultated regular heart rate with occasional missed beats.  Patient is asymptomatic and unaware of this.  Reviewed most recent EKG from September which revealed sinus rhythm with frequent PVCs, bifascicular block. Vasovagal syncope-no further recurrence over the last 4 years. Hyperlipidemia-discussed statin therapy, he again declines, offered to check his cholesterol however he kindly declines advising he would not take any medications for this.  Position-return in 6 months.         Medication Adjustments/Labs and Tests  Ordered: Current medicines are reviewed at length with the patient today.  Concerns regarding medicines are outlined above.  No orders of the defined types were placed in this encounter.  No orders of the defined types were placed in this encounter.   Patient Instructions  Medication Instructions:  Your physician recommends that you continue on your current medications as directed. Please refer to the Current Medication list given to you today.  *If you need a refill on your cardiac medications before your next appointment, please call your pharmacy*   Lab Work: None If you have labs (blood work) drawn today and your tests are completely normal, you will receive your results only by: MyChart Message (if you have MyChart) OR A paper copy in the mail If you have any lab test that is abnormal or we need to change your treatment, we will call you to review the results.   Testing/Procedures: None   Follow-Up: At [facility], you and your health needs are our priority.  As part of our continuing mission to provide you with exceptional heart care, we have created designated Provider Care Teams.  These Care Teams include your primary Cardiologist (physician) and Advanced Practice Providers (APPs -  Physician Assistants and Nurse Practitioners) who all work together to provide you with the care you need, when you need it.  We recommend signing up for the patient  portal called "MyChart".  Sign up information is provided on this After Visit Summary.  MyChart is used to connect with patients for Virtual Visits (Telemedicine).  Patients are able to view lab/test results, encounter notes, upcoming appointments, etc.  Non-urgent messages can be sent to your provider as well.   To learn more about what you can do with MyChart, go to [email or web address].    Your next appointment:   6 month(s)  Provider:   Wallis Bamberg, NP [facility])    Other Instructions None     Signed, Flossie Dibble, NP  11/08/2022 7:22 PM    [facility]

## 2022-11-08 NOTE — Patient Instructions (Signed)
Medication Instructions:  Your physician recommends that you continue on your current medications as directed. Please refer to the Current Medication list given to you today.  *If you need a refill on your cardiac medications before your next appointment, please call your pharmacy*   Lab Work: None If you have labs (blood work) drawn today and your tests are completely normal, you will receive your results only by: MyChart Message (if you have MyChart) OR A paper copy in the mail If you have any lab test that is abnormal or we need to change your treatment, we will call you to review the results.   Testing/Procedures: None   Follow-Up: At [facility], you and your health needs are our priority.  As part of our continuing mission to provide you with exceptional heart care, we have created designated Provider Care Teams.  These Care Teams include your primary Cardiologist (physician) and Advanced Practice Providers (APPs -  Physician Assistants and Nurse Practitioners) who all work together to provide you with the care you need, when you need it.  We recommend signing up for the patient portal called "MyChart".  Sign up information is provided on this After Visit Summary.  MyChart is used to connect with patients for Virtual Visits (Telemedicine).  Patients are able to view lab/test results, encounter notes, upcoming appointments, etc.  Non-urgent messages can be sent to your provider as well.   To learn more about what you can do with MyChart, go to [email or web address].    Your next appointment:   6 month(s)  Provider:   Jennifer Woody, NP ([place])    Other Instructions None  

## 2022-12-28 ENCOUNTER — Ambulatory Visit: Payer: Medicare HMO | Admitting: Cardiology

## 2023-03-13 DIAGNOSIS — R600 Localized edema: Secondary | ICD-10-CM | POA: Insufficient documentation

## 2023-03-14 ENCOUNTER — Telehealth: Payer: Self-pay | Admitting: Cardiology

## 2023-03-14 DIAGNOSIS — R600 Localized edema: Secondary | ICD-10-CM

## 2023-03-14 NOTE — Telephone Encounter (Signed)
Office calling trying to schedule an appointment to get a TEE done. Please advise.

## 2023-03-14 NOTE — Telephone Encounter (Signed)
PCP requested an echo for bilateral lower extremity edema.

## 2023-03-15 NOTE — Telephone Encounter (Signed)
Caller is following up on getting patient scheduled for a TEE Stress test.

## 2023-03-15 NOTE — Telephone Encounter (Signed)
It appears to have been scheduled by Malachi Carl  Thank you!

## 2023-03-21 DIAGNOSIS — I5189 Other ill-defined heart diseases: Secondary | ICD-10-CM | POA: Insufficient documentation

## 2023-03-21 DIAGNOSIS — I272 Pulmonary hypertension, unspecified: Secondary | ICD-10-CM | POA: Insufficient documentation

## 2023-04-18 ENCOUNTER — Other Ambulatory Visit: Payer: Medicare HMO

## 2023-05-28 LAB — LAB REPORT - SCANNED
EGFR: 78
TSH: 2.93 (ref 0.41–5.90)

## 2023-06-01 ENCOUNTER — Encounter: Payer: Self-pay | Admitting: Cardiology

## 2023-06-01 ENCOUNTER — Ambulatory Visit: Payer: Medicare HMO | Attending: Cardiology | Admitting: Cardiology

## 2023-06-01 VITALS — BP 94/58 | HR 84 | Ht 71.0 in | Wt 179.6 lb

## 2023-06-01 DIAGNOSIS — R0609 Other forms of dyspnea: Secondary | ICD-10-CM

## 2023-06-01 DIAGNOSIS — I493 Ventricular premature depolarization: Secondary | ICD-10-CM | POA: Diagnosis not present

## 2023-06-01 DIAGNOSIS — M7989 Other specified soft tissue disorders: Secondary | ICD-10-CM | POA: Insufficient documentation

## 2023-06-01 DIAGNOSIS — I272 Pulmonary hypertension, unspecified: Secondary | ICD-10-CM | POA: Diagnosis not present

## 2023-06-01 DIAGNOSIS — J410 Simple chronic bronchitis: Secondary | ICD-10-CM

## 2023-06-01 DIAGNOSIS — E782 Mixed hyperlipidemia: Secondary | ICD-10-CM

## 2023-06-01 DIAGNOSIS — R55 Syncope and collapse: Secondary | ICD-10-CM

## 2023-06-01 NOTE — Patient Instructions (Signed)
Medication Instructions:  Your physician recommends that you continue on your current medications as directed. Please refer to the Current Medication list given to you today.  *If you need a refill on your cardiac medications before your next appointment, please call your pharmacy*   Lab Work: BMP, ProBNP, Direct LDL-today If you have labs (blood work) drawn today and your tests are completely normal, you will receive your results only by: MyChart Message (if you have MyChart) OR A paper copy in the mail If you have any lab test that is abnormal or we need to change your treatment, we will call you to review the results.   Testing/Procedures: Your physician has requested that you have an echocardiogram. Echocardiography is a painless test that uses sound waves to create images of your heart. It provides your doctor with information about the size and shape of your heart and how well your heart's chambers and valves are working. This procedure takes approximately one hour. There are no restrictions for this procedure. Please do NOT wear cologne, perfume, aftershave, or lotions (deodorant is allowed). Please arrive 15 minutes prior to your appointment time.  Please note: We ask at that you not bring children with you during ultrasound (echo/ vascular) testing. Due to room size and safety concerns, children are not allowed in the ultrasound rooms during exams. Our front office staff cannot provide observation of children in our lobby area while testing is being conducted. An adult accompanying a patient to their appointment will only be allowed in the ultrasound room at the discretion of the ultrasound technician under special circumstances. We apologize for any inconvenience.    Follow-Up: At [facility], you and your health needs are our priority.  As part of our continuing mission to provide you with exceptional heart care, we have created designated Provider Care Teams.  These Care Teams  include your primary Cardiologist (physician) and Advanced Practice Providers (APPs -  Physician Assistants and Nurse Practitioners) who all work together to provide you with the care you need, when you need it.  We recommend signing up for the patient portal called "MyChart".  Sign up information is provided on this After Visit Summary.  MyChart is used to connect with patients for Virtual Visits (Telemedicine).  Patients are able to view lab/test results, encounter notes, upcoming appointments, etc.  Non-urgent messages can be sent to your provider as well.   To learn more about what you can do with MyChart, go to [email or web address].    Your next appointment:   3 month(s)  The format for your next appointment:   In Person  Provider:   Gypsy Balsam, MD    Other Instructions NA

## 2023-06-01 NOTE — Progress Notes (Signed)
Cardiology Office Note:    Date:  06/01/2023   ID:  Kyle Payne, DOB [birth year], MRN [id number]  PCP:  Olive Bass, MD  Cardiologist:  Gypsy Balsam, MD    Referring MD: Olive Bass, MD   Chief Complaint  Patient presents with   Leg Swelling    History of Present Illness:    Kyle Payne is a 82 y.o. male past medical history significant for vasovagal syncope, dyslipidemia, intolerant to multiple statin, bifascicular block his story quite interesting because for many years he would passed out every March.  However for 4 years he did not do it he comes today to my office with chief complaint swollen legs today his legs are minimal swelling but he said it would be worse at evening time.  He takes Aldactone 25 mg daily but he said it does not help him much.  Otherwise fine still trying to be active get short of breath somewhat but no dizziness no passing out  Past Medical History:  Diagnosis Date   Acute respiratory failure with hypoxia and hypercapnia (HCC) 07/09/2020   Altered mental status 07/22/2020   Asymptomatic PVCs    Bifascicular block 08/11/2020   Bronchitis, complicated 01/21/2020   Community acquired pneumonia 07/26/2020   Formatting of this note might be different from the original. 06/2020: hosp, intubated   COPD (chronic obstructive pulmonary disease) (HCC)    COVID-19 determined by clinical diagnostic criteria 08/27/2020   Formatting of this note might be different from the original. 08/27/2020   Encephalopathy 10/30/2017   Formatting of this note might be different from the original. 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   aspirin EC 81 MG tablet Take 81 mg by mouth daily. Swallow whole.   Cyanocobalamin 2000 MCG TBCR Take 2,000 mcg by mouth daily.   gabapentin (NEURONTIN) 800 MG tablet Take 800 mg by mouth 4 (four) times daily.   spironolactone (ALDACTONE) 25 MG tablet Take 25 mg by mouth daily.     Allergies:   Ibuprofen, Niacin, Niacin and related, and Rosuvastatin   Social History   Socioeconomic History   Marital status: Married    Spouse name: Not on file   Number of children: Not on file   Years of education: Not on file   Highest education level: Not on file  Occupational History   Not on file  Tobacco Use   Smoking status: Former   Smokeless tobacco: Never  Vaping Use   Vaping status: Never Used  Substance and Sexual Activity   Alcohol use: Never   Drug use: Never   Sexual activity: Not on file  Other Topics Concern   Not on file  Social History Narrative   Not on file   Social Determinants of Health   Financial Resource Strain: Not on file  Food Insecurity: Not  on file  Transportation Needs: Not on file  Physical Activity: Not on file  Stress: Not on file  Social Connections: Unknown (12/05/2021)   Received from [facility]   Social Network    Social Network: Not on file     Family History: The patient's family history includes Breast cancer in his sister and sister; Hypertension in his father; Stroke in his father. ROS:   Please see the history of present illness.    All 14 point review of systems negative except as described per history of present illness  EKGs/Labs/Other Studies Reviewed:    EKG Interpretation Date/Time:  Friday June 01 2023 10:30:16  EST Ventricular Rate:  82 PR Interval:  164 QRS Duration:  152 QT Interval:  398 QTC Calculation: 464 R Axis:   263  Text Interpretation: Normal sinus rhythm Right bundle branch block Septal infarct (cited on or before 10-Jul-2020) Abnormal ECG When compared with ECG of 10-Jul-2020 03:35, Premature atrial complexes are no longer Present Vent. rate has increased BY  35 BPM Questionable change in initial forces of Septal leads Confirmed by Gypsy Balsam [phone]) on 06/01/2023 10:32:33 AM    Recent Labs: No results found for requested labs within last 365 days.  Recent Lipid Panel    Component Value Date/Time   CHOL 182 07/11/2020 0322   CHOL 252 (H) 01/07/2018 1040   TRIG 54 07/11/2020 0322   TRIG 53 07/11/2020 0322   HDL 38 (L) 07/11/2020 0322   HDL 45 01/07/2018 1040   CHOLHDL 4.8 07/11/2020 0322   VLDL 11 07/11/2020 0322   LDLCALC 133 (H) 07/11/2020 0322   LDLCALC 180 (H) 01/07/2018 1040   LDLDIRECT 127 (H) 04/14/2021 1621    Physical Exam:    VS:  BP (!) 94/58 (BP Location: Left Arm, Patient Position: Sitting)   Pulse 84   Ht 5\' 11"  (1.803 m)   Wt 179 lb 9.6 oz (81.5 kg)   SpO2 92%   BMI 25.05 kg/m     Wt Readings from Last 3 Encounters:  06/01/23 179 lb 9.6 oz (81.5 kg)  11/08/22 177 lb 12.8 oz (80.6 kg)  04/12/22 176 lb 3.2 oz (79.9 kg)     GEN:  Well nourished, well developed in no acute distress HEENT: Normal NECK: No JVD; No carotid bruits LYMPHATICS: No lymphadenopathy CARDIAC: RRR, no murmurs, no rubs, no gallops RESPIRATORY:  Clear to auscultation without rales, wheezing or rhonchi  ABDOMEN: Soft, non-tender, non-distended MUSCULOSKELETAL:  No edema; No deformity  SKIN: Warm and dry LOWER EXTREMITIES: no swelling NEUROLOGIC:  Alert and oriented x 3 PSYCHIATRIC:  Normal affect   ASSESSMENT:    1. PVC's (premature ventricular contractions)   2. Leg swelling   3. Pulmonary hypertension (HCC)   4. Simple chronic bronchitis (HCC)   5.  Hyperlipidemia, mixed   6. Vasovagal syncope    PLAN:    In order of problems listed above:  Swollen legs obviously concerning for congestive heart failure.  Will schedule him to have echocardiogram to assess left ventricle ejection fraction was right ventricle size and function, we will do proBNP and Chem-7 today. Pulmonary hypertension obesity will be medicated to get it with echocardiogram. PVCs he does not feel any palpitations. Dyslipidemia, I did review K PN however data I have is from 2021 we will make arrangements for fasting lipid profile to be done   Medication Adjustments/Labs and Tests Ordered: Current medicines are reviewed at length with the patient today.  Concerns  regarding medicines are outlined above.  Orders Placed This Encounter  Procedures   EKG 12-Lead   Medication changes: No orders of the defined types were placed in this encounter.   Signed, Georgeanna Lea, MD, [facility] 06/01/2023 10:42 AM    [facility]

## 2023-06-01 NOTE — Addendum Note (Signed)
Addended by: Baldo Ash D on: 06/01/2023 11:04 AM   Modules accepted: Orders

## 2023-06-02 LAB — BASIC METABOLIC PANEL
BUN/Creatinine Ratio: 14 (ref 10–24)
BUN: 11 mg/dL (ref 8–27)
CO2: 24 mmol/L (ref 20–29)
Calcium: 9.7 mg/dL (ref 8.6–10.2)
Chloride: 101 mmol/L (ref 96–106)
Creatinine, Ser: 0.79 mg/dL (ref 0.76–1.27)
Glucose: 69 mg/dL — ABNORMAL LOW (ref 70–99)
Potassium: 4.4 mmol/L (ref 3.5–5.2)
Sodium: 142 mmol/L (ref 134–144)
eGFR: 89 mL/min/{1.73_m2} (ref >59)

## 2023-06-02 LAB — PRO B NATRIURETIC PEPTIDE: NT-Pro BNP: 108 pg/mL (ref 0–486)

## 2023-06-02 LAB — LDL CHOLESTEROL, DIRECT: LDL Direct: 125 mg/dL — ABNORMAL HIGH (ref 0–99)

## 2023-06-13 ENCOUNTER — Telehealth: Payer: Self-pay

## 2023-06-13 NOTE — Telephone Encounter (Signed)
Called pt regarding lab results. Pt stated that he chooses not to take cholesterol medications. Dr. Bing Matter aware.

## 2023-07-05 ENCOUNTER — Ambulatory Visit: Payer: Medicare HMO | Attending: Cardiology

## 2023-07-05 DIAGNOSIS — M7989 Other specified soft tissue disorders: Secondary | ICD-10-CM

## 2023-07-05 DIAGNOSIS — R600 Localized edema: Secondary | ICD-10-CM

## 2023-07-05 LAB — ECHOCARDIOGRAM COMPLETE
Area-P 1/2: 3.1 cm2
S' Lateral: 2.6 cm

## 2023-07-20 ENCOUNTER — Telehealth: Payer: Self-pay

## 2023-07-20 NOTE — Telephone Encounter (Signed)
Spoke to [place], notified of results

## 2023-07-20 NOTE — Telephone Encounter (Signed)
-----   Message from Gypsy Balsam sent at 07/10/2023  8:34 PM EST ----- Echocardiogram showed preserved left ventricular ejection fraction mild dilatation of the aortic root 41 mm medical therapy

## 2023-09-03 ENCOUNTER — Ambulatory Visit: Payer: Medicare HMO | Attending: Cardiology | Admitting: Cardiology

## 2023-09-03 VITALS — BP 94/60 | HR 81 | Ht 72.0 in | Wt 179.2 lb

## 2023-09-03 DIAGNOSIS — I272 Pulmonary hypertension, unspecified: Secondary | ICD-10-CM | POA: Diagnosis not present

## 2023-09-03 DIAGNOSIS — E782 Mixed hyperlipidemia: Secondary | ICD-10-CM

## 2023-09-03 DIAGNOSIS — I5189 Other ill-defined heart diseases: Secondary | ICD-10-CM

## 2023-09-03 DIAGNOSIS — I452 Bifascicular block: Secondary | ICD-10-CM | POA: Diagnosis not present

## 2023-09-03 NOTE — Progress Notes (Signed)
 Cardiology Office Note:    Date:  09/03/2023   ID:  [name], DOB Feb 18, 1941, MRN [id number]  PCP:  Madlyn Schirmer, MD  Cardiologist:  Ralene Burger, MD    Referring MD: Madlyn Schirmer, MD   Chief Complaint  Patient presents with   Follow-up    History of Present Illness:    Kyle Payne is a 83 y.o. male past medical history significant for what appears to be vasovagal syncope, dyslipidemia he is intolerant to multiple statins refused to take any medication for it, bifascicular block.  He comes today to months for follow-up last time I seen him which was in November he did have some swelling of lower extremities.  proBNP was done which was normal, echocardiogram showed preserved ejection fraction normal pulmonary pressure no significant valvular pathology.  Comes today to months for follow-up overall doing great.  Asymptomatic can do what he wants to review cardiac is looking for the growing season again.  Past Medical History:  Diagnosis Date   Acute respiratory failure with hypoxia and hypercapnia (HCC) 07/09/2020   Altered mental status 07/22/2020   Asymptomatic PVCs    Bifascicular block 08/11/2020   Bronchitis, complicated 01/21/2020   Community acquired pneumonia 07/26/2020   Formatting of this note might be different from the original. 06/2020: hosp, intubated   COPD (chronic obstructive pulmonary disease) (HCC)    COVID-19 determined by clinical diagnostic criteria 08/27/2020   Formatting of this note might be different from the original. 08/27/2020   Encephalopathy 10/30/2017   Formatting of this note might be different from the original. 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   aspirin  EC 81 MG tablet Take 81 mg by mouth daily. Swallow whole.   Cyanocobalamin 2000 MCG TBCR Take 2,000 mcg by mouth daily.   gabapentin  (NEURONTIN ) 800 MG tablet Take 800 mg by mouth 4 (four) times daily.   spironolactone (ALDACTONE) 25 MG tablet Take 25 mg by mouth daily.   traMADol (ULTRAM) 50 MG tablet Take 50 mg by mouth every 6 (six) hours as needed for moderate pain (pain score 4-6) or severe pain (pain score 7-10).   [DISCONTINUED] torsemide (DEMADEX) 10 MG tablet Take 10 mg by mouth daily.     Allergies:   Ibuprofen, Niacin, Niacin and related, and Rosuvastatin   Social History   Socioeconomic History   Marital status: Married    Spouse name: Not on file   Number of children: Not on file   Years of education: Not on file   Highest education level: Not on file  Occupational History   Not on file  Tobacco Use   Smoking status: Former   Smokeless tobacco: Never  Vaping Use   Vaping status: Never Used  Substance and Sexual Activity   Alcohol use: Never   Drug use: Never  Sexual activity: Not on file  Other Topics Concern   Not on file  Social History Narrative   Not on file   Social Drivers of Health   Financial Resource Strain: Not on file  Food Insecurity: Not on file  Transportation Needs: Not on file  Physical Activity: Not on file  Stress: Not on file  Social Connections: Unknown (12/05/2021)   Received [facility]   Social Network    Social Network: Not on file     Family History: The patient's family history includes Breast cancer in his sister and sister; Hypertension in his father; Stroke  in his father. ROS:   Please see the history of present illness.    All 14 point review of systems negative except as described per history of present illness  EKGs/Labs/Other Studies Reviewed:         Recent Labs: 05/28/2023: TSH 2.93 06/01/2023: BUN 11; Creatinine, Ser 0.79; NT-Pro BNP 108; Potassium 4.4; Sodium 142  Recent Lipid Panel    Component Value Date/Time   CHOL 182 07/11/2020 0322   CHOL 252 (H) 01/07/2018 1040   TRIG 54 07/11/2020 0322   TRIG 53 07/11/2020 0322   HDL 38 (L) 07/11/2020 0322   HDL 45 01/07/2018 1040   CHOLHDL 4.8 07/11/2020 0322   VLDL 11 07/11/2020 0322   LDLCALC 133 (H) 07/11/2020 0322   LDLCALC 180 (H) 01/07/2018 1040   LDLDIRECT 125 (H) 06/01/2023 1053    Physical Exam:    VS:  BP 94/60 (BP Location: Left Arm, Patient Position: Sitting)   Pulse 81   Ht 6' (1.829 m)   Wt 179 lb 3.2 oz (81.3 kg)   SpO2 (!) 89%   BMI 24.30 kg/m     Wt Readings from Last 3 Encounters:  09/03/23 179 lb 3.2 oz (81.3 kg)  06/01/23 179 lb 9.6 oz (81.5 kg)  11/08/22 177 lb 12.8 oz (80.6 kg)     GEN:  Well nourished, well developed in no acute distress HEENT: Normal NECK: No JVD; No carotid bruits LYMPHATICS: No lymphadenopathy CARDIAC: RRR, no murmurs, no rubs, no gallops RESPIRATORY:  Clear to auscultation without rales, wheezing or rhonchi  ABDOMEN: Soft, non-tender, non-distended MUSCULOSKELETAL:  No edema; No deformity  SKIN: Warm and dry LOWER EXTREMITIES: no swelling NEUROLOGIC:  Alert and oriented x 3 PSYCHIATRIC:  Normal affect   ASSESSMENT:    1. Diastolic dysfunction   2. Pulmonary hypertension (HCC)   3. Bifascicular block   4. Hyperlipidemia, mixed    PLAN:    In order of problems listed above:  History of syncope denies having any.  Stable no dizziness no passing out. Diastolic dysfunction and last echocardiogram showed preserved left ventricle ejection fraction is hemodynamically stable continue present management.  Swelling of  lower extremities is much better.  He does not he much.  He is scheduled by primary care physician to see pulmonologist adjustments Anemia. Bifascicular block asymptomatic continue monitoring, dyslipidemia I did review his labs done by primary care physician it was a few years ago.  LDL 166 but he is intolerant to multiple statin does not want to take any medication for it.   Medication Adjustments/Labs and Tests Ordered: Current medicines are reviewed at length with the patient today.  Concerns regarding medicines are outlined above.  No orders of the defined types were placed in this encounter.  Medication changes: No orders of the defined types were placed in this encounter.   Signed, Devorah Fonder.  Gordan Latina, MD, [facility] 09/03/2023 10:51 AM    [facility]

## 2023-09-03 NOTE — Patient Instructions (Signed)

## 2023-09-11 ENCOUNTER — Telehealth: Payer: Self-pay

## 2023-09-11 NOTE — Telephone Encounter (Signed)
   [facility] Pre-operative Risk Assessment    Request for surgical clearance:  What type of surgery is being performed? Anterior Cervical Discectomy and Interbody Fusions and other procedures as indicated    When is this surgery scheduled? TBD   What type of clearance is required (medical clearance vs. Pharmacy clearance to hold med vs. Both)? Medical  Are there any medications that need to be held prior to surgery and how long? Not indicated    Practice name and name of physician performing surgery? Dr. Garnette Gunner with [facility]    What is your office phone number [phone]    7.   What is your office fax number [phone]  8.   Anesthesia type (None, local, MAC, general) ? General    [place] Kyle Payne 09/11/2023, 1:34 PM  _________________________________________________________________   (provider comments below)

## 2023-09-20 DIAGNOSIS — R42 Dizziness and giddiness: Secondary | ICD-10-CM | POA: Insufficient documentation

## 2023-09-20 NOTE — Telephone Encounter (Signed)
   Patient Name: Kyle Payne  DOB: October 17, 1940 MRN: [id number]  Primary Cardiologist: Gypsy Balsam, MD  Chart reviewed as part of pre-operative protocol coverage. Given past medical history and time since last visit, based on ACC/AHA guidelines, [name] is at acceptable risk for the planned procedure without further cardiovascular testing.   Pt was seen by Dr. Bing Matter on 09/03/2023 and was doing well. Per Wallis Bamberg, NP, "I reviewed his most recent note, it appears the patient can meet greater than 4 METS of activity, was doing well, no further syncopal events.  He does have diastolic dysfunction however he appears to be optimized from a cardiac perspective.   Patient should be able to undergo ACDF at an acceptable risk."  Regarding ASA therapy, we recommend continuation of ASA throughout the perioperative period.  However, if the surgeon feels that cessation of ASA is required in the perioperative period, it may be stopped 5-7 days prior to surgery with a plan to resume it as soon as felt to be feasible from a surgical standpoint in the post-operative period.  I will route this recommendation to the requesting party via Epic fax function and remove from pre-op pool.  Please call with questions.  Joylene Grapes, NP 09/20/2023, 12:58 PM

## 2023-09-20 NOTE — Telephone Encounter (Signed)
 Requesting office is calling back in for an update on clearance.

## 2024-02-11 DIAGNOSIS — R55 Syncope and collapse: Secondary | ICD-10-CM | POA: Insufficient documentation

## 2024-02-11 DIAGNOSIS — I959 Hypotension, unspecified: Secondary | ICD-10-CM | POA: Insufficient documentation

## 2024-02-11 DIAGNOSIS — S0083XA Contusion of other part of head, initial encounter: Secondary | ICD-10-CM | POA: Insufficient documentation

## 2024-02-11 DIAGNOSIS — R471 Dysarthria and anarthria: Secondary | ICD-10-CM | POA: Insufficient documentation

## 2024-02-19 DIAGNOSIS — R0781 Pleurodynia: Secondary | ICD-10-CM | POA: Insufficient documentation

## 2024-03-27 ENCOUNTER — Encounter: Payer: Self-pay | Admitting: Cardiology

## 2024-03-27 ENCOUNTER — Ambulatory Visit: Attending: Cardiology | Admitting: Cardiology

## 2024-03-27 VITALS — BP 90/70 | HR 81 | Ht 72.0 in | Wt 162.0 lb

## 2024-03-27 DIAGNOSIS — J410 Simple chronic bronchitis: Secondary | ICD-10-CM

## 2024-03-27 DIAGNOSIS — R55 Syncope and collapse: Secondary | ICD-10-CM | POA: Diagnosis not present

## 2024-03-27 DIAGNOSIS — E782 Mixed hyperlipidemia: Secondary | ICD-10-CM

## 2024-03-27 DIAGNOSIS — I5189 Other ill-defined heart diseases: Secondary | ICD-10-CM

## 2024-03-27 NOTE — Progress Notes (Signed)
 Cardiology Office Note:    Date:  03/27/2024   ID:  Kyle Payne, DOB 1940/11/20, MRN [id number]  PCP:  Ofilia Lamar CROME, MD  Cardiologist:  Lamar Fitch, MD    Referring MD: Ofilia Lamar CROME, MD   Chief Complaint  Patient presents with   Follow-up    History of Present Illness:    Kyle Payne is a 83 y.o. male past medical history significant for vasovagal syncope, dyslipidemia, intolerant to multiple statins refused to take any medication for it, bifascicular block but monitor did not show any high degree block.  Comes today to months for follow-up overall doing great.  Asymptomatic still have a guardian and he works in the garden a lot with no difficulties denies any chest pain tightness squeezing pressure mid chest no palpitations dizziness swelling of lower extremities  Past Medical History:  Diagnosis Date   Acute respiratory failure with hypoxia and hypercapnia (HCC) 07/09/2020   Altered mental status 07/22/2020   Asymptomatic PVCs    Bifascicular block 08/11/2020   Bronchitis, complicated 01/21/2020   Community acquired pneumonia 07/26/2020   Formatting of this note might be different from the original. 06/2020: hosp, intubated   COPD (chronic obstructive pulmonary disease) (HCC)    COVID-19 determined by clinical diagnostic criteria 08/27/2020   Formatting of this note might be different from the original. 08/27/2020   Encephalopathy 10/30/2017   Formatting of this note might be different from the original. 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   Cyanocobalamin (B-12) 2500 MCG TABS Take 1,200 mcg by mouth daily.   gabapentin  (NEURONTIN ) 800 MG tablet Take 800 mg by mouth 4 (four) times daily.   omeprazole (PRILOSEC) 20 MG capsule Take 20 mg by mouth daily.   traMADol (ULTRAM) 50 MG tablet Take 50 mg by mouth every 6 (six) hours as needed for moderate pain (pain score 4-6) or severe pain (pain score 7-10).   [DISCONTINUED] Cyanocobalamin 2000 MCG TBCR Take 2,000 mcg by mouth daily.     Allergies:   Ibuprofen, Niacin, Niacin and related, and Rosuvastatin   Social History   Socioeconomic History   Marital status: Married    Spouse name: Not on file   Number of children: Not on file   Years of education: Not on file   Highest education level: Not on file  Occupational History   Not on file  Tobacco Use   Smoking status: Former   Smokeless tobacco: Never  Vaping Use   Vaping status: Never Used  Substance and Sexual Activity   Alcohol use: Never   Drug use: Never   Sexual activity: Not on file  Other Topics Concern   Not on file  Social History Narrative   Not on file   Social Drivers of Health   Financial Resource Strain: Not on file  Food Insecurity:  Low Risk  (02/19/2024)   Received from [facility]   Hunger Vital Sign    Within the past 12 months, you worried that your food would run out before you got money to buy more: Never true    Within the past 12 months, the food you bought just didn't last and you didn't have money to get more. : Never true  Transportation Needs: No Transportation Needs (02/19/2024)   Received from [facility]    In the past 12 months, has lack of reliable transportation kept you from medical appointments, meetings, work or from getting things needed for daily  living? : No  Physical Activity: Not on file  Stress: Not on file  Social Connections: Unknown (12/05/2021)   Received from [facility]   Social Network    Social Network: Not on file     Family History: The patient's family history includes Breast cancer in his sister and sister; Hypertension in his father; Stroke in his father. ROS:   Please see the history of present illness.    All 14 point review of systems negative except as described per history of present illness  EKGs/Labs/Other Studies Reviewed:         Recent Labs: 05/28/2023: TSH 2.93 06/01/2023: BUN 11; Creatinine, Ser 0.79; NT-Pro BNP 108; Potassium 4.4; Sodium 142  Recent Lipid Panel    Component Value Date/Time   CHOL 182 07/11/2020 0322   CHOL 252 (H) 01/07/2018 1040   TRIG 54 07/11/2020 0322   TRIG 53 07/11/2020 0322   HDL 38 (L) 07/11/2020 0322   HDL 45 01/07/2018 1040   CHOLHDL 4.8 07/11/2020 0322   VLDL 11 07/11/2020 0322   LDLCALC 133 (H) 07/11/2020 0322   LDLCALC 180 (H) 01/07/2018 1040   LDLDIRECT 125 (H) 06/01/2023 1053    Physical Exam:    VS:  BP 90/70   Pulse 81   Ht 6' (1.829 m)   Wt 162 lb (73.5 kg)   SpO2 91%   BMI 21.97 kg/m     Wt Readings from Last 3 Encounters:  03/27/24 162 lb (73.5 kg)  09/03/23 179 lb 3.2 oz (81.3 kg)  06/01/23 179 lb 9.6 oz (81.5 kg)     GEN:  Well nourished, well developed in no acute distress HEENT: Normal NECK: No JVD; No carotid bruits LYMPHATICS: No lymphadenopathy CARDIAC: RRR, no murmurs, no rubs, no gallops RESPIRATORY:  Clear to auscultation without rales, wheezing or rhonchi  ABDOMEN: Soft, non-tender, non-distended MUSCULOSKELETAL:  No edema; No deformity  SKIN: Warm and dry LOWER EXTREMITIES: no swelling NEUROLOGIC:  Alert and oriented x 3 PSYCHIATRIC:  Normal affect   ASSESSMENT:    1. Near syncope   2. Simple chronic bronchitis (HCC)   3. Diastolic dysfunction   4. Hyperlipidemia, mixed   5. Vasovagal syncope    PLAN:     In order of problems listed above:  Near syncope denies having any asking to stay well-hydrated. Dyslipidemia he does not want to take any medication for it he said he is doing fine he is okay. Diastolic dysfunction hemodynamically compensated without any symptoms.    Medication Adjustments/Labs and Tests Ordered: Current medicines are reviewed at length with the patient today.  Concerns regarding medicines are outlined above.  No orders of the defined types were placed in this encounter.  Medication changes: No orders of the defined types were placed in this encounter.   Signed, Lamar DOROTHA Fitch, MD, [facility] 03/27/2024 4:36 PM  [facility]

## 2024-03-27 NOTE — Patient Instructions (Signed)

## 2024-05-24 DEATH — deceased
